# Patient Record
Sex: Female | Born: 1980 | Race: Black or African American | Hispanic: No | Marital: Single | Smoking: Former smoker
Health system: Southern US, Community
[De-identification: ages and names within clinical notes are randomized; demographics above are authoritative.]

## PROBLEM LIST (undated history)

## (undated) DIAGNOSIS — Z789 Other specified health status: Secondary | ICD-10-CM

---
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
## 2001-01-08 ENCOUNTER — Ambulatory Visit (HOSPITAL_COMMUNITY): Admission: RE | Admit: 2001-01-08 | Discharge: 2001-01-08 | Payer: Self-pay | Admitting: Obstetrics and Gynecology

## 2001-01-08 ENCOUNTER — Encounter: Payer: Self-pay | Admitting: Obstetrics and Gynecology

## 2001-03-30 ENCOUNTER — Inpatient Hospital Stay (HOSPITAL_COMMUNITY): Admission: AD | Admit: 2001-03-30 | Discharge: 2001-03-30 | Payer: Self-pay | Admitting: Obstetrics and Gynecology

## 2001-05-21 ENCOUNTER — Observation Stay (HOSPITAL_COMMUNITY): Admission: AD | Admit: 2001-05-21 | Discharge: 2001-05-21 | Payer: Self-pay | Admitting: Obstetrics and Gynecology

## 2001-05-27 ENCOUNTER — Inpatient Hospital Stay (HOSPITAL_COMMUNITY): Admission: AD | Admit: 2001-05-27 | Discharge: 2001-05-27 | Payer: Self-pay | Admitting: Obstetrics and Gynecology

## 2001-06-05 ENCOUNTER — Inpatient Hospital Stay (HOSPITAL_COMMUNITY): Admission: AD | Admit: 2001-06-05 | Discharge: 2001-06-05 | Payer: Self-pay | Admitting: Obstetrics and Gynecology

## 2001-06-24 ENCOUNTER — Inpatient Hospital Stay (HOSPITAL_COMMUNITY): Admission: AD | Admit: 2001-06-24 | Discharge: 2001-06-24 | Payer: Self-pay | Admitting: Obstetrics and Gynecology

## 2001-06-28 ENCOUNTER — Inpatient Hospital Stay (HOSPITAL_COMMUNITY): Admission: AD | Admit: 2001-06-28 | Discharge: 2001-07-03 | Payer: Self-pay | Admitting: Obstetrics and Gynecology

## 2002-07-03 ENCOUNTER — Encounter: Payer: Self-pay | Admitting: Emergency Medicine

## 2002-07-03 ENCOUNTER — Emergency Department (HOSPITAL_COMMUNITY): Admission: EM | Admit: 2002-07-03 | Discharge: 2002-07-03 | Payer: Self-pay | Admitting: Emergency Medicine

## 2003-02-18 ENCOUNTER — Emergency Department (HOSPITAL_COMMUNITY): Admission: AD | Admit: 2003-02-18 | Discharge: 2003-02-18 | Payer: Self-pay | Admitting: Internal Medicine

## 2003-04-16 ENCOUNTER — Emergency Department (HOSPITAL_COMMUNITY): Admission: AD | Admit: 2003-04-16 | Discharge: 2003-04-16 | Payer: Self-pay | Admitting: Family Medicine

## 2003-07-04 ENCOUNTER — Ambulatory Visit (HOSPITAL_COMMUNITY): Admission: RE | Admit: 2003-07-04 | Discharge: 2003-07-04 | Payer: Self-pay | Admitting: Obstetrics & Gynecology

## 2003-08-14 ENCOUNTER — Ambulatory Visit (HOSPITAL_COMMUNITY): Admission: RE | Admit: 2003-08-14 | Discharge: 2003-08-14 | Payer: Self-pay | Admitting: Obstetrics & Gynecology

## 2003-08-27 ENCOUNTER — Inpatient Hospital Stay (HOSPITAL_COMMUNITY): Admission: AD | Admit: 2003-08-27 | Discharge: 2003-08-27 | Payer: Self-pay | Admitting: Obstetrics

## 2003-10-11 ENCOUNTER — Observation Stay (HOSPITAL_COMMUNITY): Admission: AD | Admit: 2003-10-11 | Discharge: 2003-10-11 | Payer: Self-pay | Admitting: Obstetrics

## 2003-11-10 ENCOUNTER — Observation Stay (HOSPITAL_COMMUNITY): Admission: AD | Admit: 2003-11-10 | Discharge: 2003-11-11 | Payer: Self-pay | Admitting: Obstetrics & Gynecology

## 2003-12-15 ENCOUNTER — Ambulatory Visit (HOSPITAL_COMMUNITY): Admission: RE | Admit: 2003-12-15 | Discharge: 2003-12-15 | Payer: Self-pay

## 2003-12-21 ENCOUNTER — Inpatient Hospital Stay (HOSPITAL_COMMUNITY): Admission: AD | Admit: 2003-12-21 | Discharge: 2003-12-21 | Payer: Self-pay | Admitting: Obstetrics

## 2004-01-05 ENCOUNTER — Inpatient Hospital Stay (HOSPITAL_COMMUNITY): Admission: AD | Admit: 2004-01-05 | Discharge: 2004-01-05 | Payer: Self-pay | Admitting: Obstetrics & Gynecology

## 2004-01-08 ENCOUNTER — Inpatient Hospital Stay (HOSPITAL_COMMUNITY): Admission: AD | Admit: 2004-01-08 | Discharge: 2004-01-10 | Payer: Self-pay | Admitting: Obstetrics

## 2004-10-21 ENCOUNTER — Emergency Department (HOSPITAL_COMMUNITY): Admission: EM | Admit: 2004-10-21 | Discharge: 2004-10-22 | Payer: Self-pay | Admitting: Emergency Medicine

## 2005-03-17 HISTORY — PX: DILATION AND CURETTAGE OF UTERUS: SHX78

## 2005-07-23 ENCOUNTER — Ambulatory Visit (HOSPITAL_COMMUNITY): Admission: AD | Admit: 2005-07-23 | Discharge: 2005-07-24 | Payer: Self-pay | Admitting: Obstetrics and Gynecology

## 2005-07-24 ENCOUNTER — Encounter (INDEPENDENT_AMBULATORY_CARE_PROVIDER_SITE_OTHER): Payer: Self-pay | Admitting: Specialist

## 2005-08-27 ENCOUNTER — Emergency Department (HOSPITAL_COMMUNITY): Admission: EM | Admit: 2005-08-27 | Discharge: 2005-08-27 | Payer: Self-pay | Admitting: Family Medicine

## 2005-08-30 ENCOUNTER — Inpatient Hospital Stay (HOSPITAL_COMMUNITY): Admission: AD | Admit: 2005-08-30 | Discharge: 2005-08-30 | Payer: Self-pay | Admitting: Family Medicine

## 2005-09-21 ENCOUNTER — Emergency Department (HOSPITAL_COMMUNITY): Admission: EM | Admit: 2005-09-21 | Discharge: 2005-09-21 | Payer: Self-pay | Admitting: *Deleted

## 2005-12-14 ENCOUNTER — Emergency Department (HOSPITAL_COMMUNITY): Admission: EM | Admit: 2005-12-14 | Discharge: 2005-12-14 | Payer: Self-pay | Admitting: Emergency Medicine

## 2006-03-17 HISTORY — PX: TUBAL LIGATION: SHX77

## 2006-05-22 ENCOUNTER — Ambulatory Visit (HOSPITAL_COMMUNITY): Admission: RE | Admit: 2006-05-22 | Discharge: 2006-05-22 | Payer: Self-pay | Admitting: Obstetrics

## 2006-06-15 ENCOUNTER — Inpatient Hospital Stay (HOSPITAL_COMMUNITY): Admission: AD | Admit: 2006-06-15 | Discharge: 2006-06-15 | Payer: Self-pay | Admitting: Obstetrics

## 2006-06-18 ENCOUNTER — Inpatient Hospital Stay (HOSPITAL_COMMUNITY): Admission: AD | Admit: 2006-06-18 | Discharge: 2006-06-18 | Payer: Self-pay | Admitting: Obstetrics & Gynecology

## 2006-07-19 ENCOUNTER — Inpatient Hospital Stay (HOSPITAL_COMMUNITY): Admission: AD | Admit: 2006-07-19 | Discharge: 2006-07-19 | Payer: Self-pay | Admitting: Obstetrics

## 2006-08-17 ENCOUNTER — Inpatient Hospital Stay (HOSPITAL_COMMUNITY): Admission: AD | Admit: 2006-08-17 | Discharge: 2006-08-17 | Payer: Self-pay | Admitting: Obstetrics & Gynecology

## 2006-09-01 ENCOUNTER — Inpatient Hospital Stay (HOSPITAL_COMMUNITY): Admission: AD | Admit: 2006-09-01 | Discharge: 2006-09-01 | Payer: Self-pay | Admitting: Obstetrics

## 2006-09-07 ENCOUNTER — Inpatient Hospital Stay (HOSPITAL_COMMUNITY): Admission: AD | Admit: 2006-09-07 | Discharge: 2006-09-07 | Payer: Self-pay | Admitting: Obstetrics

## 2006-09-13 ENCOUNTER — Inpatient Hospital Stay (HOSPITAL_COMMUNITY): Admission: AD | Admit: 2006-09-13 | Discharge: 2006-09-13 | Payer: Self-pay | Admitting: Obstetrics & Gynecology

## 2006-09-23 ENCOUNTER — Inpatient Hospital Stay (HOSPITAL_COMMUNITY): Admission: AD | Admit: 2006-09-23 | Discharge: 2006-09-23 | Payer: Self-pay | Admitting: Obstetrics

## 2006-10-03 ENCOUNTER — Inpatient Hospital Stay (HOSPITAL_COMMUNITY): Admission: AD | Admit: 2006-10-03 | Discharge: 2006-10-05 | Payer: Self-pay | Admitting: Obstetrics

## 2006-10-12 ENCOUNTER — Inpatient Hospital Stay (HOSPITAL_COMMUNITY): Admission: AD | Admit: 2006-10-12 | Discharge: 2006-10-12 | Payer: Self-pay | Admitting: Obstetrics

## 2006-10-16 ENCOUNTER — Inpatient Hospital Stay (HOSPITAL_COMMUNITY): Admission: AD | Admit: 2006-10-16 | Discharge: 2006-10-20 | Payer: Self-pay | Admitting: Obstetrics

## 2006-10-17 ENCOUNTER — Inpatient Hospital Stay (HOSPITAL_COMMUNITY): Admission: AD | Admit: 2006-10-17 | Discharge: 2006-10-20 | Payer: Self-pay | Admitting: Obstetrics

## 2006-10-18 ENCOUNTER — Encounter: Payer: Self-pay | Admitting: Obstetrics

## 2006-12-07 ENCOUNTER — Emergency Department (HOSPITAL_COMMUNITY): Admission: EM | Admit: 2006-12-07 | Discharge: 2006-12-08 | Payer: Self-pay | Admitting: Emergency Medicine

## 2008-09-26 ENCOUNTER — Emergency Department (HOSPITAL_COMMUNITY): Admission: EM | Admit: 2008-09-26 | Discharge: 2008-09-26 | Payer: Self-pay | Admitting: Family Medicine

## 2009-09-22 ENCOUNTER — Emergency Department (HOSPITAL_COMMUNITY): Admission: EM | Admit: 2009-09-22 | Discharge: 2009-09-22 | Payer: Self-pay | Admitting: Emergency Medicine

## 2009-10-28 ENCOUNTER — Emergency Department (HOSPITAL_COMMUNITY): Admission: EM | Admit: 2009-10-28 | Discharge: 2009-10-28 | Payer: Self-pay | Admitting: Family Medicine

## 2009-10-30 ENCOUNTER — Emergency Department (HOSPITAL_COMMUNITY): Admission: EM | Admit: 2009-10-30 | Discharge: 2009-10-30 | Payer: Self-pay | Admitting: Emergency Medicine

## 2010-05-30 LAB — WET PREP, GENITAL: Yeast Wet Prep HPF POC: NONE SEEN

## 2010-05-30 LAB — URINE MICROSCOPIC-ADD ON

## 2010-05-30 LAB — URINALYSIS, ROUTINE W REFLEX MICROSCOPIC
Bilirubin Urine: NEGATIVE
Bilirubin Urine: NEGATIVE
Glucose, UA: NEGATIVE mg/dL
Hgb urine dipstick: NEGATIVE
Nitrite: NEGATIVE
Protein, ur: NEGATIVE mg/dL
Specific Gravity, Urine: 1.027 (ref 1.005–1.030)
Urobilinogen, UA: 1 mg/dL (ref 0.0–1.0)

## 2010-05-30 LAB — GC/CHLAMYDIA PROBE AMP, GENITAL
Chlamydia, DNA Probe: NEGATIVE
GC Probe Amp, Genital: NEGATIVE

## 2010-06-02 LAB — POCT PREGNANCY, URINE: Preg Test, Ur: NEGATIVE

## 2010-07-30 NOTE — Op Note (Signed)
NAMENICOLAS, [name]   MEDICAL RECORD NO.:  [id number]          PATIENT TYPE:  INP   LOCATION:  9130                          FACILITY:  [facility]   PHYSICIAN:  Charles A. Clearance Coots, M.D.DATE OF BIRTH:  [birth year]   DATE OF PROCEDURE:  10/19/2006  DATE OF DISCHARGE:                               OPERATIVE REPORT   PREOPERATIVE DIAGNOSIS:  Desires sterilization.   POSTOPERATIVE DIAGNOSIS:  Desires sterilization.   PROCEDURE:  Bilateral partial salpingectomy.   SURGEON:  Charles A. Clearance Coots, MD   ANESTHESIA:  Epidural.   ESTIMATED BLOOD LOSS:  Negligible.   COMPLICATIONS:  None.   SPECIMEN:  Approximately 2-cm segments of right and left fallopian tube.   OPERATION:  The patient was brought to the operating room and after  satisfactory redosing of the epidural, the abdomen was prepped and  draped in the usual sterile fashion.  A small inferior umbilical  incision was made through the skin with the scalpel and was deepened  down to the fascia with a curved Mayo scissors bluntly.  Right-angle  retractors were placed in the incision and the fascia was grasped with  Kocher forceps and was cut in between with curved Mayo scissors.  The  incision was extended to the left and to the right with curved Mayo  scissors.  The peritoneum was entered bluntly with the [employer]  retractors and the right fallopian tube was identified and was grasped  with a Babcock clamp.  The tube was followed and grasped with Babcock  clamps from the cornual end to the fimbrial end and then regrasped in  the isthmic area of the tube with the Babcock clamp.  A knuckle of tube  beneath the Babcock clamp was ligated with 0 plain catgut and a section  of tube above the knot was excised with Metzenbaum scissors and  submitted to pathology for evaluation.  There was no active bleeding  from the tubal stumps, therefore placed back in its normal anatomic  position.  The same procedure  was performed on the opposite side without  complications.  The abdomen was then closed as follows:  Peritoneum and  fascia was closed as one with a continuous suture of 2-0 Vicryl; the  skin was closed with a continuous subcuticular suture of 3-0 Monocryl.  A sterile bandage was applied to the incision closure.  The surgical  technician indicated that all needle, sponge and instrument counts were  correct x2.  The patient tolerated the procedure well and was  transported to the recovery room in satisfactory condition.     Charles A. Clearance Coots, M.D.  Electronically Signed    CAH/MEDQ  D:  10/19/2006  T:  10/19/2006  Job:  [id number]

## 2010-08-02 NOTE — Discharge Summary (Signed)
[name], [name]           ACCOUNT NO.:  [id number]   MEDICAL RECORD NO.:  [id number]          PATIENT TYPE:  MAT   LOCATION:  MATC                          [facility]ACILITY:  [facility]   PHYSICIAN:  Charles A. Clearance Coots, M.D.DATE OF BIRTH:  1980-08-23   DATE OF ADMISSION:  10/16/2006  DATE OF DISCHARGE:  10/20/2006                               DISCHARGE SUMMARY   ADMITTING DIAGNOSES:  1. Thirty-eight weeks gestation, active labor.  2. Previous cesarean section, desired trial of labor.  3. Desired sterilization.   DISCHARGE DIAGNOSES:  1. Thirty-eight weeks gestation, active labor.  2. Previous cesarean section, desired trial of labor.  3. Desired sterilization.  4. Status post normal spontaneous vaginal delivery, viable female, on      10/18/2006 at 0541 after Apgar's of 9 at 1 minute, 9 at 5 minutes,      weight of 2730 grams, length of 49.5 cm, status post bilateral      partial salpingectomy on postpartum day #1.  5. Mother and infant discharged home on postpartum day #2, in good      condition   REASON FOR ADMISSION:  This is a 30 year old G4, P2, estimated date of  confinement of 10/30/2006, presented with uterine contractions. The  patient has a history of previous cesarean section followed by a second  pregnancy with a normal spontaneous vaginal delivery. The patient  desired trial of labor with this pregnancy. Current pregnancy was  uncomplicated.   PAST MEDICAL HISTORY:  Surgery:  Cesarean section.  Illnesses:  None.   MEDICATIONS:  Prenatal vitamins   ALLERGIES:  IODINE.   SOCIAL HISTORY:  Single. Negative tobacco, alcohol or recreational drug  use.   PHYSICAL EXAMINATION:  GENERAL:  Well-nourished, well-developed female  in no acute distress, afebrile.  VITAL SIGNS:  Vital signs were stable.  LUNGS:  Lungs were clear to auscultation bilaterally.  HEART:  Heart: Regular rate and rhythm.  ABDOMEN:  Abdomen gravid, nontender.  Cervix 3 cm dilated, 90% effaced  and  vertex at minus two station.   IMPRESSION:  This is a [redacted] weeks gestation, active labor, previous  cesarean section, desired trial of labor, desired sterilization.   HOSPITAL COURSE:  The patient was admitted and progressed to the normal  spontaneous vaginal delivery. Viable infant without complications.  She  was taken to the operating room on postpartum day #1 for tubal ligation  and this was performed without complications. The remainder of the  postpartum course was uncomplicated.  The patient was discharged home on  post partum day #2 in good condition.   LABORATORY DATA:  Discharge laboratory values; hemoglobin 8.1,  hematocrit 24.4, white blood cell count 16,000, platelets 122,000. The  patient did have anemia postpartum, but this was clinically unremarkable  hemodynamically with no orthostatic blood pressure changes   DISCHARGE DISPOSITION:  1. Medications; continue prenatal vitamins, iron was prescribed for      anemia, ibuprofen was prescribed for pain.  2. Routine written instructions were given for discharge after vaginal      delivery and tubal ligation.  3. The patient is to call office for follow-up appointment  in 2 weeks.      Charles A. Clearance Coots, M.D.  Electronically Signed     CAH/MEDQ  D:  10/22/2006  T:  10/22/2006  Job:  [id number]

## 2010-08-02 NOTE — Op Note (Signed)
[facility]  Patient:    [name], [name] Visit Number: [id number] MRN: [id number]          Service Type: OBS Location: 910B 9163 01 Attending Physician:  Melony Overly Dictated by:   Janeece Riggers Dareen Piano, M.D. Proc. Date: 06/29/01 Admit Date:  06/28/2001                             Operative Report  PREOPERATIVE DIAGNOSES:       1. Intrauterine pregnancy at 45 weeks estimated                                  gestational age.                               2. Intrauterine growth retardation.                               3. Double nuchal cord.                               4. Compound presentation.  POSTOPERATIVE DIAGNOSES:      1. Intrauterine pregnancy at 24 weeks estimated                                  gestational age.                               2. Intrauterine growth retardation.                               3. Double nuchal cord.                               4. Compound presentation.  PROCEDURE:                    Primary low transverse cesarean section.  SURGEON:                      Mark E. Dareen Piano, M.D.  ANESTHESIA:                   Epidural.  ANTIBIOTICS:                  Ancef 1 gm.  SPECIMENS:                    None.  ESTIMATED BLOOD LOSS:         900 cc.  COMPLICATIONS:                None.  DRAIN:                        Foley to bedside drainage.  DESCRIPTION OF PROCEDURE:     The patient was taken to the operating room where her epidural anesthetic was reinjected.  Once an adequate level was reached, the patient was prepped with  Hibiclens and a Foley catheter was placed.  She was draped in the usual fashion for this procedure.  A Pfannenstiel incision was made.  This was carried down to the fascia.  The fascia was entered in the midline and extended laterally with the Mayo scissors.  The rectus muscles were dissected from the fascia with the Bovie. The rectus muscles were divided in the midline and taken  superiorly and inferiorly.  The parietal peritoneum was entered sharply.  The bladder flap was taken down sharply.  A low transverse uterine incision was made in the midline and extended laterally with blunt dissection.  The amniotic sac was entered sharply.  The infant was delivered with the vacuum extractor and the double nuchal cord reduced.  The remaining infant was then delivered.  The cord was clamped and cut and the infant handed to the waiting ICU team.  The cord blood was then obtained.  The placenta was then manually removed.  The uterus was exteriorized.  The uterine cavity was wiped with a wet lap.  The uterine incision was closed in a single layer of #0 chromic in a running locking fashion.  A small area of bleeding on the right corner was made hemostatic with interrupted #0 Monocryl suture.  The bladder flap was not closed.  The uterus was placed back in the abdominal cavity.  The abdominal gutters were wiped out with a wet lap.  Hemostasis was checked and felt to be adequate.  The frail-appearing ____ rectus muscles were reapproximated in the midline using #3-0 chromic in a running fashion.  The fascia was closed using #0 Monocryl in a running fashion.  Stainless steel clips were used to close the skin.  The patient tolerated the procedure well and was taken to the recovery room in stable condition.  The instrument and lap counts were correct x 2. Dictated by:   Janeece Riggers Dareen Piano, M.D. Attending Physician:  Melony Overly DD:  06/29/01 TD:  06/29/01 Job: [id number]

## 2010-08-02 NOTE — Op Note (Signed)
[name], [name]           ACCOUNT NO.:  [id number]   MEDICAL RECORD NO.:  [id number]          PATIENT TYPE:  AMB   LOCATION:  MATC                          FACILITY:  [facility]   PHYSICIAN:  James A. Ashley Royalty, M.D.DATE OF BIRTH:  01-24-1981   DATE OF PROCEDURE:  DATE OF DISCHARGE:  07/23/2005                                 OPERATIVE REPORT   PREOPERATIVE DIAGNOSIS:  Inevitable/missed abortion at approximately 8  weeks' gestation.   POSTOPERATIVE DIAGNOSIS:  Inevitable/missed abortion at approximately 8  weeks' gestation, pathology pending.   PROCEDURE:  Suction dilatation and curettage.   SURGEON:  Rudy Jew. Ashley Royalty, M.D.   ANESTHESIA:  Monitoring anesthesia care with 1% Xylocaine paracervical block  (20 mL).   FINDINGS:  Uterus sounded to approximately 10 cm.  Moderate amount of the  apparent products of conception obtained.   ESTIMATED BLOOD LOSS:  50 mL.   COMPLICATIONS:  None.   PACKS AND DRAINS:  None.   PROCEDURE:  The patient was taken to the operating room and placed in the  dorsal supine position.  After IV sedation was administered, she was placed  in the lithotomy position and prepped and draped in the usual manner for  vaginal surgery.  A posterior weighted retractor was placed per vagina.  The  anterior of the cervix was grasped with a single-tooth tenaculum.  The  uterus was gently sounded to approximately 10 cm.  Xylocaine 1% 20 mL was  infiltrated the cervix circumferentially to create a paracervical block.  The cervix was then dilated to a size 25 [place] with [employer] dilators.  Initially an 8 mm suction curette was introduced into the uterine cavity.  After several passes with the suction curette, it was evident that  additional tissue was present in the uterus.  Hence, the operator chose to  change to a 10 mm curette.  A 10 mm curette was introduced into the uterine  cavity and suction applied.  Additional products of conception were then  obtained.   After several passes with a 10 mm curette, no additional tissue  was obtained.  At this point the patient was felt to have benefited  maximally from the surgical procedure.  The vaginal instruments removed.  There was a small laceration on the right side of the cervical tenaculum  site.  This  was easily closed with 2-0 chromic in a figure-of-eight fashion.  Hemostasis  was noted and the procedure was terminated.  Intraoperatively the patient  received 1 g of Ancef.   The patient was then taken to the recovery room in excellent condition.      James A. Ashley Royalty, M.D.  Electronically Signed     JAM/MEDQ  D:  07/24/2005  T:  07/25/2005  Job:  [id number]

## 2010-08-02 NOTE — Discharge Summary (Signed)
[facility]  Patient:    [name], [name] Visit Number: [id number] MRN: [id number]          Service Type: OBS Location: 910A 9135 01 Attending Physician:  Melony Overly Dictated by:   Gerrit Friends. Aldona Bar, M.D. Admit Date:  06/28/2001 Discharge Date: 07/03/2001                             Discharge Summary  DISCHARGE DIAGNOSES: 1. A 38 week pregnancy, delivered, 4 pounds 13 ounce female infant with Apgars    of 8 and 9. 2. Blood type O+. 3. Intrauterine growth restriction. 4. Failed induction.  PROCEDURES:  Primary low transverse cesarean section.  HISTORY OF PRESENT ILLNESS:  This 30 year old, gravida 1, para 0, was followed in the office closely for IUGR.  She was admitted on 06/28/01 for induction.  HOSPITAL COURSE:  She began be receiving Cytotec, and subsequently went into good labor.  She underwent amniotomy with production of clear fluid, and afterwards was noted to have a compound presentation, and was taken to the operating room for delivery by a primary low transverse cesarean section.  She was delivered of a 4 pound 13 ounce female infant with good Apgars.  There was a double nuchal cord, in addition to the compound presentation.  Her postpartum course was benign.  Her discharge hemoglobin was 7.1, white count 11,400, platelet count 135,000.  This had been repeated from 06/30/01, with essentially the same values except for a platelet count that was now rising.  On the morning of 07/02/01, she was ambulating well, tolerating a regular diet well, having normal bowel and bladder function, was afebrile, her wound was clean and dry.  She was not breast-feeding.  She was tolerating a regular diet well, and she was deemed ready for discharge.  She was having normal bowel and bladder function.  Accordingly, she was given all appropriate prescriptions after a detailed instruction sheet.  DISCHARGE MEDICATIONS: 1. Motrin 600 mg q.6h. 2. Tylox  one or two q.4-6h. p.r.n. severe pain. 3. Ferrous sulfate 300 mg at least once to b.i.d. until her followup in the    office in approximately four weeks time.  CONDITION ON DISCHARGE:  Improved.  At the time of discharge, her staples were removed and her wound was steri-striped with benzoin. Dictated by:   Gerrit Friends. Aldona Bar, M.D. Attending Physician:  Melony Overly DD:  07/02/01 TD:  07/03/01 Job: [id number]

## 2010-10-02 ENCOUNTER — Encounter (HOSPITAL_COMMUNITY): Payer: Self-pay | Admitting: *Deleted

## 2010-10-02 ENCOUNTER — Inpatient Hospital Stay (HOSPITAL_COMMUNITY)
Admission: AD | Admit: 2010-10-02 | Discharge: 2010-10-03 | Disposition: A | Discharge status: Home / Self Care | Payer: Self-pay | Source: Ambulatory Visit | Attending: Obstetrics and Gynecology | Admitting: Obstetrics and Gynecology

## 2010-10-02 DIAGNOSIS — N938 Other specified abnormal uterine and vaginal bleeding: Secondary | ICD-10-CM | POA: Insufficient documentation

## 2010-10-02 DIAGNOSIS — N949 Unspecified condition associated with female genital organs and menstrual cycle: Secondary | ICD-10-CM | POA: Insufficient documentation

## 2010-10-02 DIAGNOSIS — N925 Other specified irregular menstruation: Secondary | ICD-10-CM

## 2010-10-02 DIAGNOSIS — A5901 Trichomonal vulvovaginitis: Secondary | ICD-10-CM | POA: Insufficient documentation

## 2010-10-02 DIAGNOSIS — A599 Trichomoniasis, unspecified: Secondary | ICD-10-CM

## 2010-10-02 HISTORY — DX: Other specified health status: Z78.9

## 2010-10-02 LAB — URINE MICROSCOPIC-ADD ON

## 2010-10-02 LAB — URINALYSIS, ROUTINE W REFLEX MICROSCOPIC
Bilirubin Urine: NEGATIVE
Hgb urine dipstick: NEGATIVE
Specific Gravity, Urine: 1.025 (ref 1.005–1.030)
pH: 7 (ref 5.0–8.0)

## 2010-10-02 NOTE — ED Notes (Signed)
Pt has had pain in lower abd for 1wk. Intermittent cramping and sharp, constant pain. Diarrhea past couple days. Today some dizziness. LMP 09/21/2010

## 2010-10-02 NOTE — Progress Notes (Cosign Needed)
Pt having lower abd cramping, clear discharge and spotting.  Pt having pain, burning and frequency with urination.  Pt LMP 09/21/2010, G4P3.  BTL 10/2006.

## 2010-10-03 ENCOUNTER — Encounter (HOSPITAL_COMMUNITY): Payer: Self-pay | Admitting: Obstetrics and Gynecology

## 2010-10-03 LAB — WET PREP, GENITAL: Yeast Wet Prep HPF POC: NONE SEEN

## 2010-10-03 MED ORDER — METRONIDAZOLE 500 MG PO TABS: 500.0000 mg | ORAL_TABLET | Freq: Two times a day (BID) | ORAL | Status: DC

## 2010-10-03 MED ORDER — METRONIDAZOLE 500 MG PO TABS: 500.0000 mg | ORAL_TABLET | Freq: Two times a day (BID) | ORAL | Status: AC

## 2010-10-03 NOTE — ED Provider Notes (Signed)
Patient's wet prep is positive for wbc and Trichomoniasis

## 2010-10-03 NOTE — ED Provider Notes (Addendum)
30 yr G4P3-0-1-3 LPM 15 September 2010, s/p Btl, sexually active, single partner x 1 yr with notice of lite blood at time of wiping today.  Lmp normal , spotting today described as lite, lasting only briefly.  No recent gyn care or gyn problems.  Denies dyspareunia, heavy menses. SH Btl, Cesarean section x1 with Ist pregnancy, age 30. PEX: Alert oriented, NAD, Abd soft with normal BS, no guarding or rebound. EFG normal female,  Vag": heavy yellow discharge,  Cx Nonpurulent.  Neg CMT Ut midplane ULN, lite ly sensitive, no suspicion of PID. Adnexa - for masses or tenderness.Unknown  Gc and chlamydia wet prep collected.

## 2010-10-03 NOTE — ED Notes (Signed)
Written and verba d/c instructions given and understanding voiced. Pt d/c amb with her mother.

## 2010-10-03 NOTE — Progress Notes (Signed)
Dr Emelda Fear in to see pt. Spec exam done. Wet prep and GC/Chlam obtained. Pt tol well

## 2010-10-04 LAB — GC/CHLAMYDIA PROBE AMP, GENITAL: Chlamydia, DNA Probe: NEGATIVE

## 2010-12-26 LAB — URINE CULTURE

## 2010-12-26 LAB — URINE MICROSCOPIC-ADD ON

## 2010-12-26 LAB — URINALYSIS, ROUTINE W REFLEX MICROSCOPIC
Glucose, UA: NEGATIVE
Ketones, ur: 15 — AB
Nitrite: POSITIVE — AB
Specific Gravity, Urine: 1.023
pH: 5.5

## 2010-12-30 LAB — CBC
HCT: 24.4 — ABNORMAL LOW
HCT: 31.8 — ABNORMAL LOW
HCT: 24.5 — ABNORMAL LOW
Hemoglobin: 10.5 — ABNORMAL LOW
Hemoglobin: 8.1 — ABNORMAL LOW
Hemoglobin: 8.1 — ABNORMAL LOW
MCHC: 33.3
MCHC: 33.2
MCV: 88.6
MCV: 90
Platelets: 162
RBC: 2.75 — ABNORMAL LOW
RBC: 3.59 — ABNORMAL LOW
RDW: 14.7 — ABNORMAL HIGH
RDW: 14.3 — ABNORMAL HIGH
WBC: 16.1 — ABNORMAL HIGH

## 2010-12-30 LAB — URINALYSIS, ROUTINE W REFLEX MICROSCOPIC
Bilirubin Urine: NEGATIVE
Glucose, UA: NEGATIVE
Hgb urine dipstick: NEGATIVE
Ketones, ur: NEGATIVE
Protein, ur: NEGATIVE
pH: 8

## 2011-01-01 LAB — URINALYSIS, ROUTINE W REFLEX MICROSCOPIC
Bilirubin Urine: NEGATIVE
Glucose, UA: NEGATIVE
Hgb urine dipstick: NEGATIVE
Hgb urine dipstick: NEGATIVE
Ketones, ur: 40 — AB
Ketones, ur: NEGATIVE
Specific Gravity, Urine: 1.025
pH: 6
pH: 6

## 2011-01-01 LAB — URINE MICROSCOPIC-ADD ON

## 2011-01-01 LAB — URINE CULTURE: Colony Count: 35000

## 2011-01-01 LAB — FETAL FIBRONECTIN: Fetal Fibronectin: NEGATIVE

## 2012-03-18 ENCOUNTER — Encounter (HOSPITAL_COMMUNITY): Payer: Self-pay | Admitting: Cardiology

## 2012-03-18 ENCOUNTER — Emergency Department (HOSPITAL_COMMUNITY)
Admission: EM | Admit: 2012-03-18 | Discharge: 2012-03-18 | Disposition: A | Discharge status: Home / Self Care | Payer: Self-pay | Attending: Emergency Medicine | Admitting: Emergency Medicine

## 2012-03-18 DIAGNOSIS — F17200 Nicotine dependence, unspecified, uncomplicated: Secondary | ICD-10-CM | POA: Insufficient documentation

## 2012-03-18 DIAGNOSIS — J029 Acute pharyngitis, unspecified: Secondary | ICD-10-CM | POA: Insufficient documentation

## 2012-03-18 DIAGNOSIS — R51 Headache: Secondary | ICD-10-CM | POA: Insufficient documentation

## 2012-03-18 DIAGNOSIS — H9209 Otalgia, unspecified ear: Secondary | ICD-10-CM | POA: Insufficient documentation

## 2012-03-18 LAB — RAPID STREP SCREEN (MED CTR MEBANE ONLY): Streptococcus, Group A Screen (Direct): NEGATIVE

## 2012-03-18 MED ORDER — AMOXICILLIN 500 MG PO CAPS: 500.0000 mg | ORAL_CAPSULE | Freq: Three times a day (TID) | ORAL | Status: DC

## 2012-03-18 MED ORDER — OXYCODONE-ACETAMINOPHEN 5-325 MG PO TABS
1.0000 | ORAL_TABLET | Freq: Once | ORAL | Status: AC
  Administered 2012-03-18: 1 via ORAL
  Filled 2012-03-18: qty 1

## 2012-03-18 MED ORDER — OXYCODONE-ACETAMINOPHEN 5-325 MG PO TABS: 1.0000 | ORAL_TABLET | ORAL | Status: DC | PRN

## 2012-03-18 NOTE — ED Provider Notes (Signed)
32 year old female comes in with several day history of a sore throat and difficulty swallowing. On exam, she has moderate erythema of the pharynx with swelling of the left peritonsillar area. However, uvula does retract in the midline. She's not having any difficulty with her secretions and I did not feel that she needs emergent drainage of a possible peritonsillar abscess. She'll be treated with antibiotics and steroids and referred to ENT for followup in the next 24 hours.  Medical screening examination/treatment/procedure(s) were conducted as a shared visit with non-physician practitioner(s) and myself.  I personally evaluated the patient during the encounter   Dione Booze, MD 03/18/12 1932

## 2012-03-18 NOTE — ED Notes (Signed)
Pt reports sore throat and left ear pain for the past week. Unknown fever. Reports generalized fatigue.

## 2012-03-18 NOTE — ED Provider Notes (Signed)
History   This chart was scribed for non-physician practitioner working with Carly Booze, MD by Frederik Pear, ED Scribe. This patient was seen in room TR06C/TR06C and the patient's care was started at 1916.   CSN: [id number]  Arrival date & time 03/18/12  1742   First MD Initiated Contact with Patient 03/18/12 1916      Chief Complaint  Patient presents with  . Sore Throat  . Otalgia    (Consider location/radiation/quality/duration/timing/severity/associated sxs/prior treatment) Patient is a 32 y.o. female presenting with pharyngitis and ear pain.  Sore Throat This is a new problem. The current episode started more than 1 week ago. The problem occurs constantly. The problem has not changed since onset.Associated symptoms include headaches. Pertinent negatives include no abdominal pain. Nothing aggravates the symptoms. Nothing relieves the symptoms. She has tried nothing for the symptoms.  Otalgia Associated symptoms include headaches and sore throat. Pertinent negatives include no abdominal pain.    KARLEE STAFF is a 32 y.o. female who presents to the Emergency Department complaining of a constant, moderate sore throat with associated left-sided otalgia and headache that began last week. She denies any associated nausea, rhinorrhea, fever, emesis, rashes, or abdominal pain. She reports that her children have recently been sick with similar symptoms.  Past Medical History  Diagnosis Date  . No pertinent past medical history     Past Surgical History  Procedure Date  . Dilation and curettage of uterus 2007  . Tubal ligation 2008  . Cesarean section 2004    History reviewed. No pertinent family history.  History  Substance Use Topics  . Smoking status: Current Every Day Smoker  . Smokeless tobacco: Not on file  . Alcohol Use: Yes    OB History    Grav Para Term Preterm Abortions TAB SAB Ect Mult Living   4 3 0  1  1   3       Review of Systems  HENT: Positive  for ear pain and sore throat.   Gastrointestinal: Negative for abdominal pain.  Neurological: Positive for headaches.  All other systems reviewed and are negative.    Allergies  Iodine  Home Medications   Current Outpatient Rx  Name  Route  Sig  Dispense  Refill  . ASPIRIN-ACETAMINOPHEN-CAFFEINE 250-250-65 MG PO TABS   Oral   Take 1.5 tablets by mouth every 6 (six) hours as needed. For headache         . IBUPROFEN 200 MG PO TABS   Oral   Take 400 mg by mouth every 6 (six) hours as needed. For pain           BP 112/79  Pulse 98  Temp 99 F (37.2 C) (Oral)  Resp 18  SpO2 100%  Physical Exam  Nursing note and vitals reviewed. Constitutional: She is oriented to person, place, and time. She appears well-developed and well-nourished.  HENT:  Head: Normocephalic and atraumatic.  Mouth/Throat:    Eyes: Conjunctivae normal are normal. Pupils are equal, round, and reactive to light.  Neck: Normal range of motion.  Cardiovascular: Normal rate, regular rhythm and normal heart sounds.   Pulmonary/Chest: Effort normal and breath sounds normal.  Abdominal: Soft. Bowel sounds are normal.  Musculoskeletal: Normal range of motion.  Lymphadenopathy:    She has no cervical adenopathy.  Neurological: She is alert and oriented to person, place, and time.  Skin: Skin is warm and dry.  Psychiatric: She has a normal mood and affect. Her behavior is  normal. Judgment and thought content normal.    ED Course  Procedures (including critical care time)  DIAGNOSTIC STUDIES: Oxygen Saturation is 100% on room air, normal by my interpretation.    COORDINATION OF CARE:  19:20- Discussed planned course of treatment with the patient, including pain medication and an incision and drainage, who is agreeable at this time.  20:15- Medication Orders- oxycodone-acetaminophen (percocet/roxicet) 5-325 mg tablet per tablet- 1 tablet.  20:32- Recheck- She has a follow up appointment with Dr.  Lazarus Salines for ENT on 01/03.  Results for orders placed during the hospital encounter of 03/18/12  RAPID STREP SCREEN      Component Value Range   Streptococcus, Group A Screen (Direct) NEGATIVE  NEGATIVE      Labs Reviewed  RAPID STREP SCREEN   No results found.   No diagnosis found.  Pharyngitis with significant oropharyngeal swelling.  No airway compromise. Discussed with Dr. Preston Fleeting and with ENT.  Antibiotics initiated.  ENT will see in office tomorrow to evaluate.  MDM    I personally performed the services described in this documentation, which was scribed in my presence. The recorded information has been reviewed and is accurate.        Jimmye Norman, NP 03/19/12 0004

## 2013-10-06 ENCOUNTER — Emergency Department (HOSPITAL_COMMUNITY)
Admission: EM | Admit: 2013-10-06 | Discharge: 2013-10-07 | Disposition: A | Discharge status: Home / Self Care | Payer: Self-pay | Attending: Emergency Medicine | Admitting: Emergency Medicine

## 2013-10-06 ENCOUNTER — Encounter (HOSPITAL_COMMUNITY): Payer: Self-pay | Admitting: Emergency Medicine

## 2013-10-06 DIAGNOSIS — R1110 Vomiting, unspecified: Secondary | ICD-10-CM | POA: Insufficient documentation

## 2013-10-06 DIAGNOSIS — Z79899 Other long term (current) drug therapy: Secondary | ICD-10-CM | POA: Insufficient documentation

## 2013-10-06 DIAGNOSIS — N390 Urinary tract infection, site not specified: Secondary | ICD-10-CM | POA: Insufficient documentation

## 2013-10-06 DIAGNOSIS — F17200 Nicotine dependence, unspecified, uncomplicated: Secondary | ICD-10-CM | POA: Insufficient documentation

## 2013-10-06 LAB — PREGNANCY, URINE: Preg Test, Ur: NEGATIVE

## 2013-10-06 LAB — URINALYSIS, ROUTINE W REFLEX MICROSCOPIC
Bilirubin Urine: NEGATIVE
GLUCOSE, UA: NEGATIVE mg/dL
HGB URINE DIPSTICK: NEGATIVE
Ketones, ur: NEGATIVE mg/dL
Nitrite: NEGATIVE
PH: 6.5 (ref 5.0–8.0)
PROTEIN: NEGATIVE mg/dL
Specific Gravity, Urine: 1.023 (ref 1.005–1.030)
Urobilinogen, UA: 0.2 mg/dL (ref 0.0–1.0)

## 2013-10-06 LAB — COMPREHENSIVE METABOLIC PANEL
ALK PHOS: 69 U/L (ref 39–117)
ALT: 26 U/L (ref 0–35)
ANION GAP: 13 (ref 5–15)
AST: 25 U/L (ref 0–37)
Albumin: 4.3 g/dL (ref 3.5–5.2)
BILIRUBIN TOTAL: 0.4 mg/dL (ref 0.3–1.2)
BUN: 9 mg/dL (ref 6–23)
CHLORIDE: 103 meq/L (ref 96–112)
CO2: 22 meq/L (ref 19–32)
CREATININE: 0.58 mg/dL (ref 0.50–1.10)
Calcium: 9.4 mg/dL (ref 8.4–10.5)
GLUCOSE: 96 mg/dL (ref 70–99)
POTASSIUM: 4.3 meq/L (ref 3.7–5.3)
Sodium: 138 mEq/L (ref 137–147)
Total Protein: 8 g/dL (ref 6.0–8.3)

## 2013-10-06 LAB — LIPASE, BLOOD: LIPASE: 29 U/L (ref 11–59)

## 2013-10-06 LAB — CBC WITH DIFFERENTIAL/PLATELET
BASOS ABS: 0 10*3/uL (ref 0.0–0.1)
BASOS PCT: 0 % (ref 0–1)
Eosinophils Absolute: 0.1 10*3/uL (ref 0.0–0.7)
Eosinophils Relative: 1 % (ref 0–5)
HCT: 34.6 % — ABNORMAL LOW (ref 36.0–46.0)
Hemoglobin: 11.4 g/dL — ABNORMAL LOW (ref 12.0–15.0)
LYMPHS PCT: 28 % (ref 12–46)
Lymphs Abs: 2.2 10*3/uL (ref 0.7–4.0)
MCH: 30.3 pg (ref 26.0–34.0)
MCHC: 32.9 g/dL (ref 30.0–36.0)
MCV: 92 fL (ref 78.0–100.0)
Monocytes Absolute: 0.7 10*3/uL (ref 0.1–1.0)
Monocytes Relative: 9 % (ref 3–12)
NEUTROS ABS: 4.8 10*3/uL (ref 1.7–7.7)
Neutrophils Relative %: 62 % (ref 43–77)
PLATELETS: 185 10*3/uL (ref 150–400)
RBC: 3.76 MIL/uL — ABNORMAL LOW (ref 3.87–5.11)
RDW: 13.2 % (ref 11.5–15.5)
WBC: 7.8 10*3/uL (ref 4.0–10.5)

## 2013-10-06 LAB — URINE MICROSCOPIC-ADD ON

## 2013-10-06 MED ORDER — CEPHALEXIN 250 MG PO CAPS
500.0000 mg | ORAL_CAPSULE | Freq: Once | ORAL | Status: AC
Start: 2013-10-06 — End: 2013-10-06
  Administered 2013-10-06: 500 mg via ORAL
  Filled 2013-10-06: qty 2

## 2013-10-06 MED ORDER — CEPHALEXIN 500 MG PO CAPS: 500.0000 mg | ORAL_CAPSULE | Freq: Two times a day (BID) | ORAL | Status: AC

## 2013-10-06 MED ORDER — ONDANSETRON 4 MG PO TBDP
4.0000 mg | ORAL_TABLET | Freq: Once | ORAL | Status: AC
  Administered 2013-10-06: 4 mg via ORAL
  Filled 2013-10-06: qty 1

## 2013-10-06 MED ORDER — SODIUM CHLORIDE 0.9 % IV BOLUS (SEPSIS)
1000.0000 mL | Freq: Once | INTRAVENOUS | Status: AC
  Administered 2013-10-06: 1000 mL via INTRAVENOUS

## 2013-10-06 MED ORDER — KETOROLAC TROMETHAMINE 15 MG/ML IJ SOLN
15.0000 mg | Freq: Once | INTRAMUSCULAR | Status: AC
  Administered 2013-10-06: 15 mg via INTRAVENOUS
  Filled 2013-10-06: qty 1

## 2013-10-06 MED ORDER — PROMETHAZINE HCL 12.5 MG PO TABS
12.5000 mg | ORAL_TABLET | Freq: Once | ORAL | Status: AC
  Administered 2013-10-06: 12.5 mg via ORAL
  Filled 2013-10-06: qty 1

## 2013-10-06 NOTE — ED Notes (Signed)
Pt states she feels dizzy and nauseous.

## 2013-10-06 NOTE — ED Notes (Signed)
Pt in c/o vomiting and dizziness upon standing since this morning, also mild headache and lower back pain, alert and oriented, no distress noted

## 2013-10-06 NOTE — ED Notes (Signed)
Will wait 30-45 minutes to ensure pt does not have an allergic reaction to the medication.

## 2013-10-06 NOTE — ED Provider Notes (Signed)
CSN: [id number]     Arrival date & time 10/06/13  4098 History   First MD Initiated Contact with Patient 10/06/13 2004     Chief Complaint  Patient presents with  . Emesis     (Consider location/radiation/quality/duration/timing/severity/associated sxs/prior Treatment) Patient is a 33 y.o. female presenting with vomiting.  Emesis Severity:  Moderate Duration:  1 day Timing:  Constant Progression:  Unchanged Chronicity:  New Recent urination:  Normal Relieved by:  Nothing Associated symptoms: headaches   Associated symptoms: no abdominal pain and no diarrhea     Past Medical History  Diagnosis Date  . No pertinent past medical history    Past Surgical History  Procedure Laterality Date  . Dilation and curettage of uterus  2007  . Tubal ligation  2008  . Cesarean section  2004   History reviewed. No pertinent family history. History  Substance Use Topics  . Smoking status: Current Every Day Smoker  . Smokeless tobacco: Not on file  . Alcohol Use: Yes   OB History   Grav Para Term Preterm Abortions TAB SAB Ect Mult Living   4 3 0  1  1   3      Review of Systems  Constitutional: Negative for activity change.  HENT: Negative for congestion.   Respiratory: Negative for cough and shortness of breath.   Cardiovascular: Negative for chest pain and leg swelling.  Gastrointestinal: Positive for nausea and vomiting. Negative for abdominal pain, diarrhea, constipation, blood in stool and abdominal distention.  Genitourinary: Negative for dysuria, flank pain and vaginal discharge.  Musculoskeletal: Positive for back pain.  Skin: Negative for color change.  Neurological: Positive for headaches. Negative for syncope.  Psychiatric/Behavioral: Negative for agitation.      Allergies  Iodine  Home Medications   Prior to Admission medications   Medication Sig Start Date End Date Taking? Authorizing Provider  ibuprofen (ADVIL,MOTRIN) 200 MG tablet Take 400 mg by mouth  every 6 (six) hours as needed. For pain   Yes Historical Provider, MD   BP 96/69  Pulse 62  Temp(Src) 98.6 F (37 C) (Oral)  Resp 20  Wt 120 lb (54.432 kg)  SpO2 100% Physical Exam  Constitutional: She is oriented to person, place, and time. She appears well-developed.  HENT:  Head: Normocephalic.  Eyes: Pupils are equal, round, and reactive to light.  Neck: Neck supple.  Cardiovascular: Normal rate.  Exam reveals no gallop and no friction rub.   No murmur heard. Pulmonary/Chest: Effort normal and breath sounds normal. No respiratory distress.  Abdominal: Soft. She exhibits no distension. There is generalized tenderness. There is no rebound.  Musculoskeletal: She exhibits no edema.  L spine point tenderness    Neurological: She is alert and oriented to person, place, and time.  Skin: Skin is warm.  Psychiatric: She has a normal mood and affect.    ED Course  Procedures (including critical care time) Labs Review Labs Reviewed  CBC WITH DIFFERENTIAL - Abnormal; Notable for the following:    RBC 3.76 (*)    Hemoglobin 11.4 (*)    HCT 34.6 (*)    All other components within normal limits  URINALYSIS, ROUTINE W REFLEX MICROSCOPIC - Abnormal; Notable for the following:    APPearance CLOUDY (*)    Leukocytes, UA LARGE (*)    All other components within normal limits  URINE MICROSCOPIC-ADD ON - Abnormal; Notable for the following:    Squamous Epithelial / LPF MANY (*)    Bacteria, UA  MANY (*)    All other components within normal limits  COMPREHENSIVE METABOLIC PANEL  LIPASE, BLOOD  PREGNANCY, URINE    Imaging Review No results found.   EKG Interpretation None      MDM   Final diagnoses:  UTI (lower urinary tract infection)   33 year old female with no significant past medical history the presents with vomiting lightheadedness since this morning. Patient also states that she is having headache and some minimal lower back pain. Also endorses frequency in  urination. Upon workup patient was found to have UTI is potentially complicated by pyelonephritis. Patient given a dose of Keflex in the department was further sent home with prescription for a seven-day course. Patient given return precautions and instructed on the importance of pulled through the UTI. Patient was symptomatically better after Phenergan and normal saline fluid. Patient was given Phenergan for her home use.    Clement SayresStaci Yarnell Kozloski, MD 10/08/13 [phone]

## 2013-10-06 NOTE — ED Notes (Signed)
Pt states "when i stand up i feel like i'm going to throw up"

## 2013-10-07 MED ORDER — PROMETHAZINE HCL 25 MG PO TABS: 12.5000 mg | ORAL_TABLET | Freq: Four times a day (QID) | ORAL | Status: DC | PRN

## 2013-10-10 NOTE — ED Provider Notes (Signed)
I saw and evaluated the patient, reviewed the resident's note and I agree with the findings and plan.   EKG Interpretation None       Patient with vomiting, likely from UTI. No vomiting here, will d/c with anti-emetics and antibiotics.   Audree CamelScott T Mattheo Swindle, MD 10/10/13 1409

## 2014-01-16 ENCOUNTER — Encounter (HOSPITAL_COMMUNITY): Payer: Self-pay | Admitting: Emergency Medicine

## 2014-07-03 ENCOUNTER — Emergency Department (HOSPITAL_COMMUNITY)
Admission: EM | Admit: 2014-07-03 | Discharge: 2014-07-04 | Disposition: A | Discharge status: Home / Self Care | Payer: Self-pay | Attending: Emergency Medicine | Admitting: Emergency Medicine

## 2014-07-03 ENCOUNTER — Encounter (HOSPITAL_COMMUNITY): Payer: Self-pay | Admitting: Adult Health

## 2014-07-03 DIAGNOSIS — Z720 Tobacco use: Secondary | ICD-10-CM | POA: Insufficient documentation

## 2014-07-03 DIAGNOSIS — A599 Trichomoniasis, unspecified: Secondary | ICD-10-CM

## 2014-07-03 DIAGNOSIS — N739 Female pelvic inflammatory disease, unspecified: Secondary | ICD-10-CM | POA: Insufficient documentation

## 2014-07-03 DIAGNOSIS — M549 Dorsalgia, unspecified: Secondary | ICD-10-CM | POA: Insufficient documentation

## 2014-07-03 DIAGNOSIS — R102 Pelvic and perineal pain: Secondary | ICD-10-CM

## 2014-07-03 DIAGNOSIS — N730 Acute parametritis and pelvic cellulitis: Secondary | ICD-10-CM

## 2014-07-03 DIAGNOSIS — Z9851 Tubal ligation status: Secondary | ICD-10-CM | POA: Insufficient documentation

## 2014-07-03 DIAGNOSIS — N949 Unspecified condition associated with female genital organs and menstrual cycle: Secondary | ICD-10-CM | POA: Insufficient documentation

## 2014-07-03 DIAGNOSIS — R1030 Lower abdominal pain, unspecified: Secondary | ICD-10-CM

## 2014-07-03 DIAGNOSIS — N898 Other specified noninflammatory disorders of vagina: Secondary | ICD-10-CM

## 2014-07-03 DIAGNOSIS — R110 Nausea: Secondary | ICD-10-CM

## 2014-07-03 DIAGNOSIS — Z3202 Encounter for pregnancy test, result negative: Secondary | ICD-10-CM | POA: Insufficient documentation

## 2014-07-03 DIAGNOSIS — A5901 Trichomonal vulvovaginitis: Secondary | ICD-10-CM | POA: Insufficient documentation

## 2014-07-03 DIAGNOSIS — N390 Urinary tract infection, site not specified: Secondary | ICD-10-CM | POA: Insufficient documentation

## 2014-07-03 LAB — URINALYSIS, ROUTINE W REFLEX MICROSCOPIC
Bilirubin Urine: NEGATIVE
GLUCOSE, UA: NEGATIVE mg/dL
Hgb urine dipstick: NEGATIVE
Ketones, ur: NEGATIVE mg/dL
NITRITE: NEGATIVE
Protein, ur: 30 mg/dL — AB
Specific Gravity, Urine: 1.029 (ref 1.005–1.030)
Urobilinogen, UA: 1 mg/dL (ref 0.0–1.0)
pH: 7.5 (ref 5.0–8.0)

## 2014-07-03 LAB — URINE MICROSCOPIC-ADD ON

## 2014-07-03 LAB — I-STAT CHEM 8, ED
BUN: 10 mg/dL (ref 6–23)
CREATININE: 0.6 mg/dL (ref 0.50–1.10)
Calcium, Ion: 1.21 mmol/L (ref 1.12–1.23)
Chloride: 104 mmol/L (ref 96–112)
GLUCOSE: 87 mg/dL (ref 70–99)
HEMATOCRIT: 35 % — AB (ref 36.0–46.0)
Hemoglobin: 11.9 g/dL — ABNORMAL LOW (ref 12.0–15.0)
Potassium: 4.2 mmol/L (ref 3.5–5.1)
Sodium: 140 mmol/L (ref 135–145)
TCO2: 23 mmol/L (ref 0–100)

## 2014-07-03 LAB — POC URINE PREG, ED: PREG TEST UR: NEGATIVE

## 2014-07-03 MED ORDER — OXYCODONE-ACETAMINOPHEN 5-325 MG PO TABS
ORAL_TABLET | ORAL | Status: AC
  Filled 2014-07-03: qty 1

## 2014-07-03 MED ORDER — OXYCODONE-ACETAMINOPHEN 5-325 MG PO TABS
1.0000 | ORAL_TABLET | Freq: Once | ORAL | Status: AC
  Administered 2014-07-03: 1 via ORAL

## 2014-07-03 MED ORDER — ONDANSETRON 4 MG PO TBDP
8.0000 mg | ORAL_TABLET | Freq: Once | ORAL | Status: AC
  Administered 2014-07-03: 8 mg via ORAL

## 2014-07-03 MED ORDER — ONDANSETRON 4 MG PO TBDP
ORAL_TABLET | ORAL | Status: AC
  Filled 2014-07-03: qty 2

## 2014-07-03 NOTE — ED Notes (Signed)
Presents with lower abdominal for 2 weeks associated with pressure in the pelvis and urinary frequency. Pt also reports white vaginal discharge, denies foul odor. Reports having this before and was dx with UTI. Denies burning with urination.

## 2014-07-03 NOTE — ED Notes (Signed)
Mercedes, pa, at the bedside.  

## 2014-07-03 NOTE — ED Provider Notes (Signed)
CSN: [id number]     Arrival date & time 07/03/14  1929 History   First MD Initiated Contact with Patient 07/03/14 2312     Chief Complaint  Patient presents with  . Urinary Tract Infection  . Abdominal Pain     (Consider location/radiation/quality/duration/timing/severity/associated sxs/prior Treatment) HPI Comments: [name] is a 34 y.o. female who presents to the ED with complaints of one week of lower abdominal pain, darker malodorous urine, increased urinary frequency, and white vaginal discharge. She states that the abdominal pain is 10/10, constant, sharp and pressure-like, radiating to her lower back, improved with Motrin, and worsens with "tightening her ab muscles". Additional associated symptoms includes mild nausea. She states she felt this way when she had a UTI in the past. She denies any fevers, chills, chest pain, shortness breath, vomiting, diarrhea, constipation, melena, hematochezia, hematemesis, constipation, dysuria, hematuria, vaginal bleeding, vaginal itching, genital lesions, flank pain, cauda equina symptoms, incontinence of urine or stool, saddle anesthesia, numbness, tingling, weakness, recent travel, sick contacts, antibiotic use, suspicious food intake, or alcohol/NSAID use. LMP 06/23/14. Sexually active with 1 female partner, unprotected.  Patient is a 34 y.o. female presenting with urinary tract infection and abdominal pain. The history is provided by the patient. No language interpreter was used.  Urinary Tract Infection This is a new problem. The current episode started in the past 7 days. The problem occurs constantly. The problem has been unchanged. Associated symptoms include abdominal pain, nausea (mild) and urinary symptoms (dark malodorous urine, increased frequency). Pertinent negatives include no arthralgias, change in bowel habit, chest pain, chills, fever, myalgias, numbness, rash, vomiting or weakness. Exacerbated by: tensing abdominal muscles. She has  tried NSAIDs for the symptoms. The treatment provided moderate relief.  Abdominal Pain Pain location:  RLQ, LLQ and suprapubic Pain quality: pressure and sharp   Pain radiates to:  Back Pain severity:  Moderate Onset quality:  Gradual Duration:  1 week Timing:  Constant Progression:  Unchanged Chronicity:  New Context: not recent illness, not recent travel, not sick contacts and not suspicious food intake   Relieved by:  NSAIDs Exacerbated by: tensing her abdominal muscles. Ineffective treatments:  None tried Associated symptoms: nausea (mild) and vaginal discharge (white)   Associated symptoms: no chest pain, no chills, no constipation, no diarrhea, no dysuria, no fever, no flatus, no hematemesis, no hematochezia, no hematuria, no melena, no shortness of breath, no vaginal bleeding and no vomiting   Risk factors: no alcohol abuse and no NSAID use     Past Medical History  Diagnosis Date  . No pertinent past medical history    Past Surgical History  Procedure Laterality Date  . Dilation and curettage of uterus  2007  . Tubal ligation  2008  . Cesarean section  2004   History reviewed. No pertinent family history. History  Substance Use Topics  . Smoking status: Current Every Day Smoker  . Smokeless tobacco: Not on file  . Alcohol Use: Yes   OB History    Gravida Para Term Preterm AB TAB SAB Ectopic Multiple Living   4 3 0  Review of Systems  Constitutional: Negative for fever and chills.  Respiratory: Negative for shortness of breath.   Cardiovascular: Negative for chest pain.  Gastrointestinal: Positive for nausea (mild) and abdominal pain. Negative for vomiting, diarrhea, constipation, blood in stool, melena, hematochezia, rectal pain, change in bowel habit, flatus and hematemesis.  Genitourinary: Positive for frequency  and vaginal discharge (white). Negative for dysuria, hematuria, flank pain, vaginal bleeding and vaginal pain.       +darker malodorous  urine  Musculoskeletal: Positive for back pain (radiating from her abdomen). Negative for myalgias and arthralgias.  Skin: Negative for color change and rash.  Allergic/Immunologic: Negative for immunocompromised state.  Neurological: Negative for weakness and numbness.  Psychiatric/Behavioral: Negative for confusion.   10 Systems reviewed and are negative for acute change except as noted in the HPI.    Allergies  Shellfish allergy and Iodine  Home Medications   Prior to Admission medications   Medication Sig Start Date End Date Taking? Authorizing Provider  ibuprofen (ADVIL,MOTRIN) 200 MG tablet Take 400 mg by mouth every 6 (six) hours as needed. For pain   Yes Historical Provider, MD  OVER THE COUNTER MEDICATION Take 2 tablets by mouth daily as needed. "Pain Reliever" [employer] brand   Yes Historical Provider, MD  promethazine (PHENERGAN) 25 MG tablet Take 0.5 tablets (12.5 mg total) by mouth every 6 (six) hours as needed for nausea or vomiting. Patient not taking: Reported on 07/03/2014 10/07/13   Clement Sayres, MD   BP 119/85 mmHg  Pulse 81  Temp(Src) 98 F (36.7 C) (Oral)  Resp 18  Ht  (1.499 m)  Wt 125 lb 6 oz (56.87 kg)  BMI 25.31 kg/m2  SpO2 100%  LMP 06/23/2014 Physical Exam  Constitutional: She is oriented to person, place, and time. Vital signs are normal. She appears well-developed and well-nourished.  Non-toxic appearance. No distress.  Afebrile, nontoxic, NAD  HENT:  Head: Normocephalic and atraumatic.  Mouth/Throat: Oropharynx is clear and moist and mucous membranes are normal.  Eyes: Conjunctivae and EOM are normal. Right eye exhibits no discharge. Left eye exhibits no discharge.  Neck: Normal range of motion. Neck supple.  Cardiovascular: Normal rate, regular rhythm, normal heart sounds and intact distal pulses.  Exam reveals no gallop and no friction rub.   No murmur heard. Pulmonary/Chest: Effort normal and breath sounds normal. No respiratory distress.  She has no decreased breath sounds. She has no wheezes. She has no rhonchi. She has no rales.  Abdominal: Soft. Normal appearance and bowel sounds are normal. She exhibits no distension. There is tenderness in the right lower quadrant, suprapubic area and left lower quadrant. There is no rigidity, no rebound, no guarding, no CVA tenderness, no tenderness at McBurney's point and negative Murphy's sign.    Soft, nondistended, +BS throughout, with diffuse lower abdomen TTP bilaterally and suprapubically, no r/g/r, neg murphy's, neg mcburney's, no CVA TTP   Genitourinary: Uterus normal. Pelvic exam was performed with patient supine. There is no rash, tenderness or lesion on the right labia. There is no tenderness or lesion on the left labia. Cervix exhibits motion tenderness, discharge and friability. Right adnexum displays no mass, no tenderness and no fullness. Left adnexum displays tenderness. Left adnexum displays no mass and no fullness. No erythema or bleeding in the vagina. Vaginal discharge found.  Chaperone present for exam. No rashes, lesions, or tenderness to external genitalia. No erythema, injury, or tenderness to vaginal mucosa. Copious amount of mucoid vaginal discharge within vaginal vault. No vaginal bleeding. No adnexal masses or fullness but +L sided adnexal tenderness. +CMT, +cervical friability, and + mucoid discharge from cervical os. Uterus non-deviated, mobile, nonTTP, and without enlargement.    Musculoskeletal: Normal range of motion.  All spinal levels with FROM intact without spinous process TTP, no bony stepoffs or deformities, no paraspinous muscle TTP  or muscle spasms. Strength 5/5 in all extremities, sensation grossly intact in all extremities, negative SLR bilaterally, gait steady and nonantalgic. No overlying skin changes.   Neurological: She is alert and oriented to person, place, and time. She has normal strength. No sensory deficit.  Skin: Skin is warm, dry and intact. No  rash noted.  Psychiatric: She has a normal mood and affect.  Nursing note and vitals reviewed.   ED Course  Procedures (including critical care time) Labs Review Labs Reviewed  WET PREP, GENITAL - Abnormal; Notable for the following:    Trich, Wet Prep FEW (*)    Clue Cells Wet Prep HPF POC FEW (*)    WBC, Wet Prep HPF POC MANY (*)    All other components within normal limits  URINALYSIS, ROUTINE W REFLEX MICROSCOPIC - Abnormal; Notable for the following:    APPearance CLOUDY (*)    Protein, ur 30 (*)    Leukocytes, UA LARGE (*)    All other components within normal limits  URINE MICROSCOPIC-ADD ON - Abnormal; Notable for the following:    Squamous Epithelial / LPF MANY (*)    Bacteria, UA FEW (*)    All other components within normal limits  CBC WITH DIFFERENTIAL/PLATELET - Abnormal; Notable for the following:    RBC 3.65 (*)    Hemoglobin 11.2 (*)    HCT 34.1 (*)    All other components within normal limits  I-STAT CHEM 8, ED - Abnormal; Notable for the following:    Hemoglobin 11.9 (*)    HCT 35.0 (*)    All other components within normal limits  RPR  HIV ANTIBODY (ROUTINE TESTING)  POC URINE PREG, ED  GC/CHLAMYDIA PROBE AMP ([place])    Imaging Review No results found.   EKG Interpretation None      MDM   Final diagnoses:  Left adnexal tenderness  Lower abdominal pain  UTI (lower urinary tract infection)  Vaginal discharge  PID (acute pelvic inflammatory disease)  Nausea  Trichomonas vaginalis infection    34 y.o. female here with lower abd pain x1 wk, urinary frequency, darker malodorous urine, and white vaginal discharge. Upreg neg. U/A looks slightly contaminated but TNTC WBC and few bacteria therefore will treat as UTI since pt has symptoms. Abd exam nonperitoneal with mild lower abdominal tenderness. Will obtain pelvic exam and get basic labs. Doubt need for imaging at this time, but will reassess after pelvic exam. Given zofran and percocet in  triage. Will reassess shortly.   12:08 AM Pelvic exam reveals L adnexal tenderness, copious amount of vaginal discharge, +CMT. Will obtain u/s to r/o TOA/PID. Will give GC/CT empiric treatment, and send home with doxycycline BID for PID treatment. Pt reporting increasing pain, it's been 4hrs since last pain pill, will give vicodin now. Chem 8 and CBC w/diff unremarkable aside from mild anemia. Will reassess shortly.   12:29 AM Wet prep showing trich, will give flagyl 2g PO. Also shows clue cells and many WBC. U/S underway, will reassess shortly.   1:05 AM Pt still in ultrasound. Will sign over care to Earley Favor NP at shift change to f/up with ultrasound results. If U/S unremarkable for TOA or other acute issue, pt can be sent home with naprosyn, norco, zofran, doxy, and bactrim and f/up with women's clinic. If any emergent U/S findings, consider xfer to women's (i.e. TOA). Please see Gail's note for further documentation of care.   BP 119/85 mmHg  Pulse 81  Temp(Src)  98 F (36.7 C) (Oral)  Resp 18  Ht 4\' 11"  (1.499 m)  Wt 125 lb 6 oz (56.87 kg)  BMI 25.31 kg/m2  SpO2 100%  LMP 06/23/2014  Meds ordered this encounter  Medications  . oxyCODONE-acetaminophen (PERCOCET/ROXICET) 5-325 MG per tablet 1 tablet    Sig:   . ondansetron (ZOFRAN-ODT) disintegrating tablet 8 mg    Sig:   . OVER THE COUNTER MEDICATION    Sig: Take 2 tablets by mouth daily as needed. "Pain Reliever" [employer] brand  . ondansetron (ZOFRAN-ODT) 4 MG disintegrating tablet    Sig:     Bartholomew Crewsavis, [place]   : cabinet override  . oxyCODONE-acetaminophen (PERCOCET/ROXICET) 5-325 MG per tablet    Sig:     Bartholomew Crewsavis, Jennaya   : cabinet override  . azithromycin (ZITHROMAX) tablet 1,000 mg    Sig:    And  . cefTRIAXone (ROCEPHIN) injection 250 mg    Sig:     Order Specific Question:  Antibiotic Indication:    Answer:  STD  . HYDROcodone-acetaminophen (NORCO/VICODIN) 5-325 MG per tablet 1 tablet    Sig:   . metroNIDAZOLE  (FLAGYL) tablet 2,000 mg    Sig:   . doxycycline (VIBRAMYCIN) 100 MG capsule    Sig: Take 1 capsule (100 mg total) by mouth 2 (two) times daily. One po bid x 14 days    Dispense:  28 capsule    Refill:  0  . ondansetron (ZOFRAN) 8 MG tablet    Sig: Take 1 tablet (8 mg total) by mouth every 8 (eight) hours as needed for nausea or vomiting.    Dispense:  10 tablet    Refill:  0  . sulfamethoxazole-trimethoprim (BACTRIM DS,SEPTRA DS) 800-160 MG per tablet    Sig: Take 1 tablet by mouth 2 (two) times daily.    Dispense:  14 tablet    Refill:  0  . HYDROcodone-acetaminophen (NORCO) 5-325 MG per tablet    Sig: Take 1 tablet by mouth every 6 (six) hours as needed for severe pain.    Dispense:  6 tablet    Refill:  0  . naproxen (NAPROSYN) 500 MG tablet    Sig: Take 1 tablet (500 mg total) by mouth 2 (two) times daily as needed for mild pain, moderate pain or headache (TAKE WITH MEALS.).    Dispense:  20 tablet    Refill:  0     Sevan Mcbroom Camprubi-Soms, PA-C 07/04/14 0113  Eber HongBrian Miller, MD 07/05/14 1113

## 2014-07-04 ENCOUNTER — Emergency Department (HOSPITAL_COMMUNITY): Payer: Self-pay

## 2014-07-04 LAB — WET PREP, GENITAL: Yeast Wet Prep HPF POC: NONE SEEN

## 2014-07-04 LAB — CBC WITH DIFFERENTIAL/PLATELET
Basophils Absolute: 0 10*3/uL (ref 0.0–0.1)
Basophils Relative: 0 % (ref 0–1)
Eosinophils Absolute: 0.2 10*3/uL (ref 0.0–0.7)
Eosinophils Relative: 2 % (ref 0–5)
HCT: 34.1 % — ABNORMAL LOW (ref 36.0–46.0)
Hemoglobin: 11.2 g/dL — ABNORMAL LOW (ref 12.0–15.0)
LYMPHS PCT: 37 % (ref 12–46)
Lymphs Abs: 3.4 10*3/uL (ref 0.7–4.0)
MCH: 30.7 pg (ref 26.0–34.0)
MCHC: 32.8 g/dL (ref 30.0–36.0)
MCV: 93.4 fL (ref 78.0–100.0)
MONO ABS: 0.9 10*3/uL (ref 0.1–1.0)
Monocytes Relative: 10 % (ref 3–12)
NEUTROS ABS: 4.5 10*3/uL (ref 1.7–7.7)
NEUTROS PCT: 51 % (ref 43–77)
Platelets: 209 10*3/uL (ref 150–400)
RBC: 3.65 MIL/uL — AB (ref 3.87–5.11)
RDW: 13.5 % (ref 11.5–15.5)
WBC: 9 10*3/uL (ref 4.0–10.5)

## 2014-07-04 LAB — GC/CHLAMYDIA PROBE AMP (~~LOC~~) NOT AT ARMC
Chlamydia: POSITIVE — AB
NEISSERIA GONORRHEA: NEGATIVE

## 2014-07-04 LAB — RPR: RPR Ser Ql: NONREACTIVE

## 2014-07-04 LAB — HIV ANTIBODY (ROUTINE TESTING W REFLEX): HIV Screen 4th Generation wRfx: NONREACTIVE

## 2014-07-04 MED ORDER — HYDROCODONE-ACETAMINOPHEN 5-325 MG PO TABS: 1.0000 | ORAL_TABLET | Freq: Four times a day (QID) | ORAL | Status: DC | PRN

## 2014-07-04 MED ORDER — SULFAMETHOXAZOLE-TRIMETHOPRIM 800-160 MG PO TABS: 1.0000 | ORAL_TABLET | Freq: Two times a day (BID) | ORAL | Status: DC

## 2014-07-04 MED ORDER — LIDOCAINE HCL (PF) 1 % IJ SOLN
INTRAMUSCULAR | Status: AC
  Administered 2014-07-04: 5 mL
  Filled 2014-07-04: qty 5

## 2014-07-04 MED ORDER — NAPROXEN 500 MG PO TABS: 500.0000 mg | ORAL_TABLET | Freq: Two times a day (BID) | ORAL | Status: DC | PRN

## 2014-07-04 MED ORDER — METRONIDAZOLE 500 MG PO TABS
2000.0000 mg | ORAL_TABLET | Freq: Once | ORAL | Status: AC
  Administered 2014-07-04: 2000 mg via ORAL
  Filled 2014-07-04: qty 4

## 2014-07-04 MED ORDER — AZITHROMYCIN 250 MG PO TABS
1000.0000 mg | ORAL_TABLET | Freq: Once | ORAL | Status: AC
  Administered 2014-07-04: 1000 mg via ORAL
  Filled 2014-07-04: qty 4

## 2014-07-04 MED ORDER — DOXYCYCLINE HYCLATE 100 MG PO CAPS: 100.0000 mg | ORAL_CAPSULE | Freq: Two times a day (BID) | ORAL | Status: DC

## 2014-07-04 MED ORDER — HYDROCODONE-ACETAMINOPHEN 5-325 MG PO TABS
1.0000 | ORAL_TABLET | Freq: Once | ORAL | Status: AC
Start: 2014-07-04 — End: 2014-07-04
  Administered 2014-07-04: 1 via ORAL
  Filled 2014-07-04: qty 1

## 2014-07-04 MED ORDER — CEFTRIAXONE SODIUM 250 MG IJ SOLR
250.0000 mg | Freq: Once | INTRAMUSCULAR | Status: AC
  Administered 2014-07-04: 250 mg via INTRAMUSCULAR
  Filled 2014-07-04: qty 250

## 2014-07-04 MED ORDER — ONDANSETRON HCL 8 MG PO TABS: 8.0000 mg | ORAL_TABLET | Freq: Three times a day (TID) | ORAL | Status: DC | PRN

## 2014-07-04 NOTE — Discharge Instructions (Signed)
Follow up with [facility] STD clinic for future STD concerns or screenings. This is the recommendation by the CDC for people with multiple sexual partners or hx of STDs. You have been treated for gonorrhea and chlamydia in the ER but the hospital will call you if lab is positive. You were tested for HIV and Syphilis, and the hospital will call you if the lab is positive. You were treated for trichomonas because this was found in your vaginal swab, you will need all sexual partners to be tested and treated. Use zofran as needed for nausea. Take doxycycline as directed for pelvic inflammatory disease, and avoid direct sunlight while taking this medication. Use naprosyn and norco as directed as needed for pain, but don't drive while taking norco. Stay very well hydrated with plenty of water throughout the day. Take Bactrim until completed for your urinary tract infection. Follow up with women's outpatient clinic in 1 week for recheck of ongoing symptoms but return to ER for emergent changing or worsening of symptoms. Please seek immediate care if you develop the following: You develop back pain.  Your symptoms are no better, or worse in 3 days. There is severe back pain or lower abdominal pain.  You develop chills.  You have a fever.  There is nausea or vomiting.  There is continued burning or discomfort with urination.     Abdominal Pain Many things can cause belly (abdominal) pain. Most times, the belly pain is not dangerous. Many cases of belly pain can be watched and treated at home. HOME CARE   Do not take medicines that help you go poop (laxatives) unless told to by your doctor.  Only take medicine as told by your doctor.  Eat or drink as told by your doctor. Your doctor will tell you if you should be on a special diet. GET HELP IF:  You do not know what is causing your belly pain.  You have belly pain while you are sick to your stomach (nauseous) or have runny poop  (diarrhea).  You have pain while you pee or poop.  Your belly pain wakes you up at night.  You have belly pain that gets worse or better when you eat.  You have belly pain that gets worse when you eat fatty foods.  You have a fever. GET HELP RIGHT AWAY IF:   The pain does not go away within 2 hours.  You keep throwing up (vomiting).  The pain changes and is only in the right or left part of the belly.  You have bloody or tarry looking poop. MAKE SURE YOU:   Understand these instructions.  Will watch your condition.  Will get help right away if you are not doing well or get worse. Document Released: 08/20/2007 Document Revised: 03/08/2013 Document Reviewed: 11/10/2012 [facility] Patient Information 2015 [place], [place]. This information is not intended to replace advice given to you by your health care provider. Make sure you discuss any questions you have with your health care provider.  Nausea, Adult Nausea is the feeling that you have an upset stomach or have to vomit. Nausea by itself is not likely a serious concern, but it may be an early sign of more serious medical problems. As nausea gets worse, it can lead to vomiting. If vomiting develops, there is the risk of dehydration.  CAUSES   Viral infections.  Food poisoning.  Medicines.  Pregnancy.  Motion sickness.  Migraine headaches.  Emotional distress.  Severe pain  from any source.  Alcohol intoxication. HOME CARE INSTRUCTIONS  Get plenty of rest.  Ask your caregiver about specific rehydration instructions.  Eat small amounts of food and sip liquids more often.  Take all medicines as told by your caregiver. SEEK MEDICAL CARE IF:  You have not improved after 2 days, or you get worse.  You have a headache. SEEK IMMEDIATE MEDICAL CARE IF:   You have a fever.  You faint.  You keep vomiting or have blood in your vomit.  You are extremely weak or dehydrated.  You have dark or bloody  stools.  You have severe chest or abdominal pain. MAKE SURE YOU:  Understand these instructions.  Will watch your condition.  Will get help right away if you are not doing well or get worse. Document Released: 04/10/2004 Document Revised: 11/26/2011 Document Reviewed: 11/13/2010 [facility] Patient Information 2015 [place], [place]. This information is not intended to replace advice given to you by your health care provider. Make sure you discuss any questions you have with your health care provider.  Pelvic Inflammatory Disease Pelvic inflammatory disease (PID) is an infection in some or all of the female organs. PID can be in the uterus, ovaries, fallopian tubes, or the surrounding tissues inside the lower belly area (pelvis). HOME CARE   If given, take your antibiotic medicine as told. Finish them even if you start to feel better.  Only take medicine as told by your doctor.  Do not have sex (intercourse) until treatment is done or as told by your doctor.  Tell your sex partner if you have PID. Your partner may need to be treated.  Keep all doctor visits. GET HELP RIGHT AWAY IF:   You have a fever.  You have more belly (abdominal) or lower belly pain.  You have chills.  You have pain when you pee (urinate).  You are not better after 72 hours.  You have more fluid (discharge) coming from your vagina or fluid that is not normal.  You need pain medicine from your doctor.  You throw up (vomit).  You cannot take your medicines.  Your partner has a sexually transmitted disease (STD). MAKE SURE YOU:   Understand these instructions.  Will watch your condition.  Will get help right away if you are not doing well or get worse. Document Released: 05/30/2008 Document Revised: 06/28/2012 Document Reviewed: 02/27/2011 [facility] Patient Information 2015 [place], [place]. This information is not intended to replace advice given to you by your health care provider. Make sure you  discuss any questions you have with your health care provider.  Sexually Transmitted Disease A sexually transmitted disease (STD) is a disease or infection often passed to another person during sex. However, STDs can be passed through nonsexual ways. An STD can be passed through:  Spit (saliva).  Semen.  Blood.  Mucus from the vagina.  Pee (urine). HOW CAN I LESSEN MY CHANCES OF GETTING AN STD?  Use:  Latex condoms.  Water-soluble lubricants with condoms. Do not use petroleum jelly or oils.  Dental dams. These are small pieces of latex that are used as a barrier during oral sex.  Avoid having more than one sex partner.  Do not have sex with someone who has other sex partners.  Do not have sex with anyone you do not know or who is at high risk for an STD.  Avoid risky sex that can break your skin.  Do not have sex if you have open sores on your  mouth or skin.  Avoid drinking too much alcohol or taking illegal drugs. Alcohol and drugs can affect your good judgment.  Avoid oral and anal sex acts.  Get shots (vaccines) for HPV and hepatitis.  If you are at risk of being infected with HIV, it is advised that you take a certain medicine daily to prevent HIV infection. This is called pre-exposure prophylaxis (PrEP). You may be at risk if:  You are a man who has sex with other men (MSM).  You are attracted to the opposite sex (heterosexual) and are having sex with more than one partner.  You take drugs with a needle.  You have sex with someone who has HIV.  Talk with your doctor about if you are at high risk of being infected with HIV. If you begin to take PrEP, get tested for HIV first. Get tested every 3 months for as long as you are taking PrEP. WHAT SHOULD I DO IF I THINK I HAVE AN STD?  See your doctor.  Tell your sex partner(s) that you have an STD. They should be tested and treated.  Do not have sex until your doctor says it is okay. WHEN SHOULD I GET  HELP? Get help right away if:  You have bad belly (abdominal) pain.  You are a man and have puffiness (swelling) or pain in your testicles.  You are a woman and have puffiness in your vagina. Document Released: 04/10/2004 Document Revised: 03/08/2013 Document Reviewed: 08/27/2012 [facility] Patient Information 2015 [place]. This information is not intended to replace advice given to you by your health care provider. Make sure you discuss any questions you have with your health care provider.  Trichomoniasis Trichomoniasis is an infection caused by an organism called Trichomonas. The infection can affect both women and men. In women, the outer female genitalia and the vagina are affected. In men, the penis is mainly affected, but the prostate and other reproductive organs can also be involved. Trichomoniasis is a sexually transmitted infection (STI) and is most often passed to another person through sexual contact.  RISK FACTORS  Having unprotected sexual intercourse.  Having sexual intercourse with an infected partner. SIGNS AND SYMPTOMS  Symptoms of trichomoniasis in women include:  Abnormal gray-green frothy vaginal discharge.  Itching and irritation of the vagina.  Itching and irritation of the area outside the vagina. Symptoms of trichomoniasis in men include:   Penile discharge with or without pain.  Pain during urination. This results from inflammation of the urethra. DIAGNOSIS  Trichomoniasis may be found during a Pap test or physical exam. Your health care provider may use one of the following methods to help diagnose this infection:  Examining vaginal discharge under a microscope. For men, urethral discharge would be examined.  Testing the pH of the vagina with a test tape.  Using a vaginal swab test that checks for the Trichomonas organism. A test is available that provides results within a few minutes.  Doing a culture test for the organism. This is not  usually needed. TREATMENT   You may be given medicine to fight the infection. Women should inform their health care provider if they could be or are pregnant. Some medicines used to treat the infection should not be taken during pregnancy.  Your health care provider may recommend over-the-counter medicines or creams to decrease itching or irritation.  Your sexual partner will need to be treated if infected. HOME CARE INSTRUCTIONS   Take medicines only as directed by your  health care provider.  Take over-the-counter medicine for itching or irritation as directed by your health care provider.  Do not have sexual intercourse while you have the infection.  Women should not douche or wear tampons while they have the infection.  Discuss your infection with your partner. Your partner may have gotten the infection from you, or you may have gotten it from your partner.  Have your sex partner get examined and treated if necessary.  Practice safe, informed, and protected sex.  See your health care provider for other STI testing. SEEK MEDICAL CARE IF:   You still have symptoms after you finish your medicine.  You develop abdominal pain.  You have pain when you urinate.  You have bleeding after sexual intercourse.  You develop a rash.  Your medicine makes you sick or makes you throw up (vomit). MAKE SURE YOU:  Understand these instructions.  Will watch your condition.  Will get help right away if you are not doing well or get worse. Document Released: 08/27/2000 Document Revised: 07/18/2013 Document Reviewed: 12/13/2012 [facility] Patient Information 2015 [place], [place]. This information is not intended to replace advice given to you by your health care provider. Make sure you discuss any questions you have with your health care provider.

## 2014-07-05 ENCOUNTER — Telehealth (HOSPITAL_BASED_OUTPATIENT_CLINIC_OR_DEPARTMENT_OTHER): Payer: Self-pay | Admitting: Emergency Medicine

## 2014-07-05 NOTE — Telephone Encounter (Signed)
Positive Chlamydia  ID verified, patient notiified of positive Chlamydia and that treatment was given while in ED with Rocephin and Zithromax. STD instructions given, patient verbalized understanding.

## 2014-07-05 NOTE — Telephone Encounter (Signed)
Positive Chlamyida Treated with Zithromax and Rocephin per protocol MD [facility] faxed.  07/05/14 left messge for patient to return called to office #

## 2015-07-05 ENCOUNTER — Encounter (HOSPITAL_COMMUNITY): Payer: Self-pay | Admitting: Emergency Medicine

## 2015-07-05 ENCOUNTER — Emergency Department (HOSPITAL_COMMUNITY): Payer: Self-pay

## 2015-07-05 ENCOUNTER — Emergency Department (HOSPITAL_COMMUNITY)
Admission: EM | Admit: 2015-07-05 | Discharge: 2015-07-05 | Disposition: A | Discharge status: Home / Self Care | Payer: Self-pay | Attending: Emergency Medicine | Admitting: Emergency Medicine

## 2015-07-05 DIAGNOSIS — R519 Headache, unspecified: Secondary | ICD-10-CM

## 2015-07-05 DIAGNOSIS — R51 Headache: Secondary | ICD-10-CM | POA: Insufficient documentation

## 2015-07-05 DIAGNOSIS — Z792 Long term (current) use of antibiotics: Secondary | ICD-10-CM | POA: Insufficient documentation

## 2015-07-05 DIAGNOSIS — F17210 Nicotine dependence, cigarettes, uncomplicated: Secondary | ICD-10-CM | POA: Insufficient documentation

## 2015-07-05 DIAGNOSIS — R42 Dizziness and giddiness: Secondary | ICD-10-CM | POA: Insufficient documentation

## 2015-07-05 MED ORDER — KETOROLAC TROMETHAMINE 30 MG/ML IJ SOLN
30.0000 mg | Freq: Once | INTRAMUSCULAR | Status: AC
  Administered 2015-07-05: 30 mg via INTRAVENOUS
  Filled 2015-07-05: qty 1

## 2015-07-05 MED ORDER — METOCLOPRAMIDE HCL 5 MG/ML IJ SOLN
10.0000 mg | Freq: Once | INTRAMUSCULAR | Status: AC
  Administered 2015-07-05: 10 mg via INTRAVENOUS
  Filled 2015-07-05: qty 2

## 2015-07-05 MED ORDER — ONDANSETRON HCL 4 MG PO TABS: 4.0000 mg | ORAL_TABLET | Freq: Four times a day (QID) | ORAL | Status: DC

## 2015-07-05 MED ORDER — SODIUM CHLORIDE 0.9 % IV BOLUS (SEPSIS)
1000.0000 mL | Freq: Once | INTRAVENOUS | Status: AC
  Administered 2015-07-05: 1000 mL via INTRAVENOUS

## 2015-07-05 MED ORDER — DIPHENHYDRAMINE HCL 50 MG/ML IJ SOLN
25.0000 mg | Freq: Once | INTRAMUSCULAR | Status: AC
  Administered 2015-07-05: 25 mg via INTRAVENOUS
  Filled 2015-07-05: qty 1

## 2015-07-05 NOTE — ED Provider Notes (Signed)
CSN: [id number]     Arrival date & time 07/05/15  7829 History   First MD Initiated Contact with Patient 07/05/15 0902     Chief Complaint  Patient presents with  . Headache    HPI Comments: 35 year old female presents with a HA for the past 2 days. She states it started all of a sudden. It is constant, throbbing, and located on her bilaterally over her temporal region and occipital region. Reports associated dizziness when walking, photophobia, phonophobia. Reports a hx of headaches but says she's never had one this severe before. No fever, chills, LOC, trauma. She has taken Tylenol and Midol with no relief.  Patient is a 35 y.o. female presenting with headaches.  Headache Associated symptoms: dizziness   Associated symptoms: no fever, no numbness, no seizures and no weakness     Past Medical History  Diagnosis Date  . No pertinent past medical history    Past Surgical History  Procedure Laterality Date  . Dilation and curettage of uterus  2007  . Tubal ligation  2008  . Cesarean section  2004   No family history on file. Social History  Substance Use Topics  . Smoking status: Current Every Day Smoker    Types: Cigars  . Smokeless tobacco: None  . Alcohol Use: Yes   OB History    Gravida Para Term Preterm AB TAB SAB Ectopic Multiple Living   4 3 0  Review of Systems  Constitutional: Negative for fever and chills.  Neurological: Positive for dizziness and headaches. Negative for tremors, seizures, syncope, speech difficulty, weakness, light-headedness and numbness.  All other systems reviewed and are negative.     Allergies  Shellfish allergy and Iodine  Home Medications   Prior to Admission medications   Medication Sig Start Date End Date Taking? Authorizing Provider  doxycycline (VIBRAMYCIN) 100 MG capsule Take 1 capsule (100 mg total) by mouth 2 (two) times daily. One po bid x 14 days 07/04/14   Mercedes Camprubi-Soms, PA-C   HYDROcodone-acetaminophen (NORCO) 5-325 MG per tablet Take 1 tablet by mouth every 6 (six) hours as needed for severe pain. 07/04/14   Mercedes Camprubi-Soms, PA-C  ibuprofen (ADVIL,MOTRIN) 200 MG tablet Take 400 mg by mouth every 6 (six) hours as needed. For pain    Historical Provider, MD  naproxen (NAPROSYN) 500 MG tablet Take 1 tablet (500 mg total) by mouth 2 (two) times daily as needed for mild pain, moderate pain or headache (TAKE WITH MEALS.). 07/04/14   Mercedes Camprubi-Soms, PA-C  ondansetron (ZOFRAN) 8 MG tablet Take 1 tablet (8 mg total) by mouth every 8 (eight) hours as needed for nausea or vomiting. 07/04/14   Mercedes Camprubi-Soms, PA-C  OVER THE COUNTER MEDICATION Take 2 tablets by mouth daily as needed. "Pain Reliever" [employer]    Historical Provider, MD  promethazine (PHENERGAN) 25 MG tablet Take 0.5 tablets (12.5 mg total) by mouth every 6 (six) hours as needed for nausea or vomiting. Patient not taking: Reported on 07/03/2014 10/07/13   Clement Sayres, MD  sulfamethoxazole-trimethoprim (BACTRIM DS,SEPTRA DS) 800-160 MG per tablet Take 1 tablet by mouth 2 (two) times daily. 07/04/14   Mercedes Camprubi-Soms, PA-C   BP 116/90 mmHg  Pulse 70  Temp(Src) 98 F (36.7 C) (Oral)  Resp 20  Ht  (1.499 m)  Wt 64.411 kg  BMI 28.67 kg/m2  SpO2 99%  LMP 06/29/2015 Physical Exam  Constitutional: She  is oriented to person, place, and time. She appears well-developed and well-nourished. No distress.  Patient has head covered and room is dark  HENT:  Head: Normocephalic and atraumatic.  Eyes: Conjunctivae are normal. Pupils are equal, round, and reactive to light. Right eye exhibits no discharge. Left eye exhibits no discharge. No scleral icterus.  Neck: Normal range of motion.  Cardiovascular: Normal rate and regular rhythm.  Exam reveals no gallop and no friction rub.   No murmur heard. Pulmonary/Chest: Effort normal and breath sounds normal. No respiratory distress. She has  no wheezes. She has no rales. She exhibits no tenderness.  Abdominal: Soft. There is no tenderness.  Neurological: She is alert and oriented to person, place, and time.  Mental Status:  Alert, oriented, thought content appropriate, able to give a coherent history. Speech fluent without evidence of aphasia. Able to follow 2 step commands without difficulty.  Cranial Nerves:  II:  Peripheral visual fields grossly normal, pupils equal, round, reactive to light. Sensitivity to light noted. III,IV, VI: ptosis not present, extra-ocular motions intact bilaterally  V,VII: smile symmetric, facial light touch sensation equal VIII: hearing grossly normal to voice  X: uvula elevates symmetrically  XI: bilateral shoulder shrug symmetric and strong XII: midline tongue extension without fassiculations Motor:  Normal tone. 5/5 in upper and lower extremities bilaterally including strong and equal grip strength and dorsiflexion/plantar flexion Sensory: Light touch normal in all extremities.  Cerebellar: normal finger-to-nose with bilateral upper extremities Gait: normal gait and balance     Skin: Skin is warm and dry.  Psychiatric: She has a normal mood and affect.    ED Course  Procedures (including critical care time) Labs Review Labs Reviewed - No data to display  Imaging Review Ct Head Wo Contrast  07/05/2015  CLINICAL DATA:  Headache for 2 days with no known trauma EXAM: CT HEAD WITHOUT CONTRAST TECHNIQUE: Contiguous axial images were obtained from the base of the skull through the vertex without intravenous contrast. COMPARISON:  None. FINDINGS: No mass lesion. No midline shift. No acute hemorrhage or hematoma. No extra-axial fluid collections. No evidence of acute infarction. Calvarium intact. IMPRESSION: Normal head CT Electronically Signed   By: Esperanza Heiraymond  Rubner M.D.   On: 07/05/2015 12:05   I have personally reviewed and evaluated these images and lab results as part of my medical  decision-making.   EKG Interpretation None     Meds given in ED:  Medications  ketorolac (TORADOL) 30 MG/ML injection 30 mg (30 mg Intravenous Given 07/05/15 0935)  diphenhydrAMINE (BENADRYL) injection 25 mg (25 mg Intravenous Given 07/05/15 0935)  metoCLOPramide (REGLAN) injection 10 mg (10 mg Intravenous Given 07/05/15 0935)  sodium chloride 0.9 % bolus 1,000 mL (0 mLs Intravenous Stopped 07/05/15 1201)    Discharge Medication List as of 07/05/2015  1:03 PM       MDM   Final diagnoses:  Acute nonintractable headache, unspecified headache type   35 year old female who presents with a HA. She has a hx of HA but there are some concerning features even though she is young and seems low risk. She's never had a HA like this before, it's the worst she's ever had, and started suddenly.  Migraine cocktail given. Patient expresses no relief from this. CT of head obtained which was negative. Pt informed of results. After recheck patient states her HA is slightly better. It was a 10/10 now 6/10. Advised NSAIDs upon discharge. Return precautions given. Patient informed of clinical course, understands  medical decision-making process, and agrees with plan.    Bethel Born, PA-C 07/06/15 1303  Leta Baptist, MD 07/11/15 [phone]

## 2015-07-05 NOTE — ED Notes (Signed)
Patient states headache "to back of head and side of head".  Patient states she has been nauseated, but denies vomiting.   Patient states "light and noise hurt me".   Patient states "i took tylenol and midol at home, but it didn't help".

## 2015-07-05 NOTE — Discharge Instructions (Signed)

## 2016-04-21 ENCOUNTER — Emergency Department (HOSPITAL_COMMUNITY)
Admission: EM | Admit: 2016-04-21 | Discharge: 2016-04-21 | Disposition: A | Discharge status: Home / Self Care | Payer: BLUE CROSS/BLUE SHIELD | Attending: Physician Assistant | Admitting: Physician Assistant

## 2016-04-21 ENCOUNTER — Encounter (HOSPITAL_COMMUNITY): Payer: Self-pay

## 2016-04-21 DIAGNOSIS — S30861A Insect bite (nonvenomous) of abdominal wall, initial encounter: Secondary | ICD-10-CM | POA: Insufficient documentation

## 2016-04-21 DIAGNOSIS — Y9259 Other trade areas as the place of occurrence of the external cause: Secondary | ICD-10-CM | POA: Diagnosis not present

## 2016-04-21 DIAGNOSIS — W57XXXA Bitten or stung by nonvenomous insect and other nonvenomous arthropods, initial encounter: Secondary | ICD-10-CM | POA: Diagnosis not present

## 2016-04-21 DIAGNOSIS — F17290 Nicotine dependence, other tobacco product, uncomplicated: Secondary | ICD-10-CM | POA: Insufficient documentation

## 2016-04-21 DIAGNOSIS — Y939 Activity, unspecified: Secondary | ICD-10-CM | POA: Diagnosis not present

## 2016-04-21 DIAGNOSIS — S40861A Insect bite (nonvenomous) of right upper arm, initial encounter: Secondary | ICD-10-CM | POA: Diagnosis not present

## 2016-04-21 DIAGNOSIS — R21 Rash and other nonspecific skin eruption: Secondary | ICD-10-CM

## 2016-04-21 DIAGNOSIS — Y999 Unspecified external cause status: Secondary | ICD-10-CM | POA: Diagnosis not present

## 2016-04-21 DIAGNOSIS — S70261A Insect bite (nonvenomous), right hip, initial encounter: Secondary | ICD-10-CM | POA: Insufficient documentation

## 2016-04-21 MED ORDER — HYDROXYZINE HCL 25 MG PO TABS: 25.0000 mg | ORAL_TABLET | Freq: Four times a day (QID) | ORAL | 0 refills | Status: DC | PRN

## 2016-04-21 MED ORDER — PREDNISONE 10 MG PO TABS: 20.0000 mg | ORAL_TABLET | Freq: Two times a day (BID) | ORAL | 0 refills | Status: DC

## 2016-04-21 NOTE — ED Notes (Signed)
Pt. Changed into blue scrubs and pt.s clothes placed in bags

## 2016-04-21 NOTE — ED Provider Notes (Signed)
[facility]PT Provider Note   CSN: [id number] Arrival date & time: 04/21/16  1508  By signing my name below, I, Talbert NanPaul Grant, attest that this documentation has been prepared under the direction and in the presence of Kerrie BuffaloHope Savonna Birchmeier, NP. Electronically Signed: Talbert NanPaul Grant, Scribe. 04/21/16. 6:06 PM.   History   Chief Complaint Chief Complaint  Patient presents with  . Rash    HPI [name] is a 36 y.o. female who presents to the Emergency Department complaining of an acute onset, diffuse, raised bumpy, itchy rash covering her right side and arm that appeared on 04/20/16 after having contact with insects. After staying in a hotel overnight on 04/19/16. Pt denies SOB, coughing, or chest tightness.     The history is provided by the patient. No language interpreter was used.    Past Medical History:  Diagnosis Date  . No pertinent past medical history     There are no active problems to display for this patient.   Past Surgical History:  Procedure Laterality Date  . CESAREAN SECTION  2004  . DILATION AND CURETTAGE OF UTERUS  2007  . TUBAL LIGATION  2008    OB History    Gravida Para Term Preterm AB Living   4 3 0   1 3   SAB TAB Ectopic Multiple Live Births   1               Home Medications    Prior to Admission medications   Medication Sig Start Date End Date Taking? Authorizing Provider  doxycycline (VIBRAMYCIN) 100 MG capsule Take 1 capsule (100 mg total) by mouth 2 (two) times daily. One po bid x 14 days 07/04/14   Mercedes Street, PA-C  HYDROcodone-acetaminophen (NORCO) 5-325 MG per tablet Take 1 tablet by mouth every 6 (six) hours as needed for severe pain. 07/04/14   Mercedes Street, PA-C  hydrOXYzine (ATARAX/VISTARIL) 25 MG tablet Take 1 tablet (25 mg total) by mouth every 6 (six) hours as needed for itching. 04/21/16   Dayven Linsley Orlene OchM Regie Bunner, NP  ibuprofen (ADVIL,MOTRIN) 200 MG tablet Take 400 mg by mouth every 6 (six) hours as needed. For pain    Historical  Provider, MD  naproxen (NAPROSYN) 500 MG tablet Take 1 tablet (500 mg total) by mouth 2 (two) times daily as needed for mild pain, moderate pain or headache (TAKE WITH MEALS.). 07/04/14   Mercedes Street, PA-C  ondansetron (ZOFRAN) 4 MG tablet Take 1 tablet (4 mg total) by mouth every 6 (six) hours. 07/05/15   Bethel BornKelly Marie Gekas, PA-C  predniSONE (DELTASONE) 10 MG tablet Take 2 tablets (20 mg total) by mouth 2 (two) times daily with a meal. 04/21/16   Latiffany Harwick Orlene OchM Butch Otterson, NP  sulfamethoxazole-trimethoprim (BACTRIM DS,SEPTRA DS) 800-160 MG per tablet Take 1 tablet by mouth 2 (two) times daily. 07/04/14   Mercedes Street, PA-C    Family History No family history on file.  Social History Social History  Substance Use Topics  . Smoking status: Current Every Day Smoker    Types: Cigars  . Smokeless tobacco: Never Used  . Alcohol use Yes     Allergies   Shellfish allergy and Iodine   Review of Systems Review of Systems  Constitutional: Negative for fever.  Respiratory: Negative for cough, chest tightness and shortness of breath.   Skin: Positive for rash.     Physical Exam Updated Vital Signs BP 121/68 (BP Location: Right Arm)   Pulse 70   Temp 98.5  F (36.9 C) (Oral)   Resp 16   Ht 4\' 11"  (1.499 m)   LMP 03/31/2016   SpO2 100%   Physical Exam  Constitutional: She is oriented to person, place, and time. She appears well-developed and well-nourished.  HENT:  Head: Normocephalic and atraumatic.  Neck:  Uvula midline.  Cardiovascular: Normal rate, regular rhythm and normal heart sounds.   Pulmonary/Chest: Effort normal and breath sounds normal.  Lymphadenopathy:    She has no cervical adenopathy.  Neurological: She is alert and oriented to person, place, and time.  Skin: Skin is warm and dry.  Multiple raised red area to right side of abdomen and the right hip, right upper arm consistent with insect bites.   Psychiatric: She has a normal mood and affect.  Nursing note and vitals  reviewed.    ED Treatments / Results   DIAGNOSTIC STUDIES: Oxygen Saturation is 100% on room air, normal by my interpretation.    COORDINATION OF CARE: 5:24 PM Discussed treatment plan with pt at bedside and pt agreed to plan.  Radiology No results found.  Procedures Procedures (including critical care time)  Medications Ordered in ED Medications - No data to display   Initial Impression / Assessment and Plan / ED Course  I have reviewed the triage vital signs and the nursing notes.   Final Clinical Impressions(s) / ED Diagnoses   Final diagnoses:  Insect bite, initial encounter  Rash    New Prescriptions Discharge Medication List as of 04/21/2016  6:13 PM    I personally performed the services described in this documentation, which was scribed in my presence. The recorded information has been reviewed and is accurate.    [place], NP 04/25/16 0001    Courteney Randall An, MD 04/28/16 1610

## 2016-04-21 NOTE — ED Triage Notes (Signed)
Per Pt, Pt is coming from [facility] with a rash noted to her right abdomen after she killed a bug in the bed. Pt reports it is getting better, but it is still painful.

## 2016-04-24 ENCOUNTER — Encounter (HOSPITAL_COMMUNITY): Payer: Self-pay | Admitting: Emergency Medicine

## 2016-04-24 DIAGNOSIS — Y999 Unspecified external cause status: Secondary | ICD-10-CM | POA: Diagnosis not present

## 2016-04-24 DIAGNOSIS — F17290 Nicotine dependence, other tobacco product, uncomplicated: Secondary | ICD-10-CM | POA: Diagnosis not present

## 2016-04-24 DIAGNOSIS — Z79899 Other long term (current) drug therapy: Secondary | ICD-10-CM | POA: Insufficient documentation

## 2016-04-24 DIAGNOSIS — Y929 Unspecified place or not applicable: Secondary | ICD-10-CM | POA: Insufficient documentation

## 2016-04-24 DIAGNOSIS — Y939 Activity, unspecified: Secondary | ICD-10-CM | POA: Insufficient documentation

## 2016-04-24 DIAGNOSIS — S40861A Insect bite (nonvenomous) of right upper arm, initial encounter: Secondary | ICD-10-CM | POA: Diagnosis present

## 2016-04-24 DIAGNOSIS — W57XXXA Bitten or stung by nonvenomous insect and other nonvenomous arthropods, initial encounter: Secondary | ICD-10-CM | POA: Insufficient documentation

## 2016-04-24 NOTE — ED Notes (Signed)
Warm blankets handed out and delay explanation provided to all patients in lobby.   

## 2016-04-24 NOTE — ED Triage Notes (Signed)
Patient reports insect bite with itchy/red skin at right upper arm today.

## 2016-04-25 ENCOUNTER — Emergency Department (HOSPITAL_COMMUNITY)
Admission: EM | Admit: 2016-04-25 | Discharge: 2016-04-25 | Disposition: A | Discharge status: Home / Self Care | Payer: BLUE CROSS/BLUE SHIELD | Attending: Emergency Medicine | Admitting: Emergency Medicine

## 2016-04-25 DIAGNOSIS — W57XXXA Bitten or stung by nonvenomous insect and other nonvenomous arthropods, initial encounter: Secondary | ICD-10-CM

## 2016-04-25 NOTE — Discharge Instructions (Signed)
Continue taking your prednisone prescribed February 5, as well as the Atarax for itching, you can apply a cool compress to the area as well as take Tylenol or ibuprofen for discomfort

## 2016-04-25 NOTE — ED Provider Notes (Signed)
[facility] Provider Note   CSN: [id number] Arrival date & time: 04/24/16  2143     History   Chief Complaint Chief Complaint  Patient presents with  . Insect Bite    HPI [name] is a 36 y.o. female.  Patient was seen February 5 for insect bites on her abdomen and thorax.  She was prescribed prednisone and Vistaril at that time.  Today she noticed that she had a bite to her right upper arm that is red and painful and she just wanted it checked out to make sure it wasn't infected.      Past Medical History:  Diagnosis Date  . No pertinent past medical history     There are no active problems to display for this patient.   Past Surgical History:  Procedure Laterality Date  . CESAREAN SECTION  2004  . DILATION AND CURETTAGE OF UTERUS  2007  . TUBAL LIGATION  2008    OB History    Gravida Para Term Preterm AB Living   4 3 0   1 3   SAB TAB Ectopic Multiple Live Births   1               Home Medications    Prior to Admission medications   Medication Sig Start Date End Date Taking? Authorizing Provider  doxycycline (VIBRAMYCIN) 100 MG capsule Take 1 capsule (100 mg total) by mouth 2 (two) times daily. One po bid x 14 days 07/04/14   Mercedes Street, PA-C  HYDROcodone-acetaminophen (NORCO) 5-325 MG per tablet Take 1 tablet by mouth every 6 (six) hours as needed for severe pain. 07/04/14   Mercedes Street, PA-C  hydrOXYzine (ATARAX/VISTARIL) 25 MG tablet Take 1 tablet (25 mg total) by mouth every 6 (six) hours as needed for itching. 04/21/16   Hope Orlene Och, NP  ibuprofen (ADVIL,MOTRIN) 200 MG tablet Take 400 mg by mouth every 6 (six) hours as needed. For pain    Historical Provider, MD  naproxen (NAPROSYN) 500 MG tablet Take 1 tablet (500 mg total) by mouth 2 (two) times daily as needed for mild pain, moderate pain or headache (TAKE WITH MEALS.). 07/04/14   Mercedes Street, PA-C  ondansetron (ZOFRAN) 4 MG tablet Take 1 tablet (4 mg total) by mouth every  6 (six) hours. 07/05/15   Bethel Born, PA-C  predniSONE (DELTASONE) 10 MG tablet Take 2 tablets (20 mg total) by mouth 2 (two) times daily with a meal. 04/21/16   Hope Orlene Och, NP  sulfamethoxazole-trimethoprim (BACTRIM DS,SEPTRA DS) 800-160 MG per tablet Take 1 tablet by mouth 2 (two) times daily. 07/04/14   Mercedes Street, PA-C    Family History No family history on file.  Social History Social History  Substance Use Topics  . Smoking status: Current Every Day Smoker    Types: Cigars  . Smokeless tobacco: Never Used  . Alcohol use Yes     Allergies   Shellfish allergy and Iodine   Review of Systems Review of Systems  Constitutional: Negative for activity change and fever.  Respiratory: Negative for shortness of breath.   Cardiovascular: Negative for chest pain.  Gastrointestinal: Negative for abdominal pain.  Skin: Positive for wound.     Physical Exam Updated Vital Signs BP 114/70 (BP Location: Left Arm)   Pulse 77   Temp 98.2 F (36.8 C) (Oral)   Resp 16   Ht 4\' 11"  (1.499 m)   Wt 68 kg   LMP 03/31/2016  SpO2 100%   BMI 30.30 kg/m   Physical Exam  Constitutional: She appears well-developed and well-nourished. No distress.  Eyes: Pupils are equal, round, and reactive to light.  Neck: Normal range of motion.  Cardiovascular: Normal rate.   Pulmonary/Chest: Effort normal.  Abdominal: Soft.  Neurological: She is alert.  Skin: There is erythema.     Nursing note and vitals reviewed.    ED Treatments / Results  Labs (all labs ordered are listed, but only abnormal results are displayed) Labs Reviewed - No data to display  EKG  EKG Interpretation None       Radiology No results found.  Procedures Procedures (including critical care time)  Medications Ordered in ED Medications - No data to display   Initial Impression / Assessment and Plan / ED Course  I have reviewed the triage vital signs and the nursing notes.  Pertinent labs &  imaging results that were available during my care of the patient were reviewed by me and considered in my medical decision making (see chart for details).      She has been encouraged to continue taking the prednisone until the whole course has been completed, as well as Vistaril.  She's also been provided with an ice pack and instructed to apply this to the area several times a day for 20 minutes at a time. She can safely take Tylenol or ibuprofen for discomfort  Final Clinical Impressions(s) / ED Diagnoses   Final diagnoses:  Insect bite, initial encounter    New Prescriptions New Prescriptions   No medications on file     Earley FavorGail Biagio Snelson, NP 04/25/16 0024    Shaune Pollackameron Isaacs, MD 04/25/16 1705

## 2016-07-18 ENCOUNTER — Emergency Department (HOSPITAL_COMMUNITY): Payer: BLUE CROSS/BLUE SHIELD

## 2016-07-18 ENCOUNTER — Other Ambulatory Visit: Payer: Self-pay

## 2016-07-18 ENCOUNTER — Encounter (HOSPITAL_COMMUNITY): Payer: Self-pay | Admitting: *Deleted

## 2016-07-18 ENCOUNTER — Emergency Department (HOSPITAL_COMMUNITY)
Admission: EM | Admit: 2016-07-18 | Discharge: 2016-07-18 | Disposition: A | Discharge status: Home / Self Care | Payer: BLUE CROSS/BLUE SHIELD | Attending: Emergency Medicine | Admitting: Emergency Medicine

## 2016-07-18 DIAGNOSIS — Y9259 Other trade areas as the place of occurrence of the external cause: Secondary | ICD-10-CM | POA: Diagnosis not present

## 2016-07-18 DIAGNOSIS — F17290 Nicotine dependence, other tobacco product, uncomplicated: Secondary | ICD-10-CM | POA: Diagnosis not present

## 2016-07-18 DIAGNOSIS — X500XXA Overexertion from strenuous movement or load, initial encounter: Secondary | ICD-10-CM | POA: Diagnosis not present

## 2016-07-18 DIAGNOSIS — Y9389 Activity, other specified: Secondary | ICD-10-CM | POA: Insufficient documentation

## 2016-07-18 DIAGNOSIS — Y990 Civilian activity done for income or pay: Secondary | ICD-10-CM | POA: Insufficient documentation

## 2016-07-18 DIAGNOSIS — R0789 Other chest pain: Secondary | ICD-10-CM

## 2016-07-18 DIAGNOSIS — R079 Chest pain, unspecified: Secondary | ICD-10-CM | POA: Diagnosis present

## 2016-07-18 DIAGNOSIS — R072 Precordial pain: Secondary | ICD-10-CM

## 2016-07-18 DIAGNOSIS — Z79899 Other long term (current) drug therapy: Secondary | ICD-10-CM | POA: Insufficient documentation

## 2016-07-18 LAB — BASIC METABOLIC PANEL
Anion gap: 7 (ref 5–15)
BUN: 12 mg/dL (ref 6–20)
CO2: 24 mmol/L (ref 22–32)
CREATININE: 0.66 mg/dL (ref 0.44–1.00)
Calcium: 9 mg/dL (ref 8.9–10.3)
Chloride: 107 mmol/L (ref 101–111)
GFR calc Af Amer: >60 mL/min (ref >60)
GFR calc non Af Amer: >60 mL/min (ref >60)
Glucose, Bld: 96 mg/dL (ref 65–99)
Potassium: 3.8 mmol/L (ref 3.5–5.1)
SODIUM: 138 mmol/L (ref 135–145)

## 2016-07-18 LAB — CBC
HCT: 37.1 % (ref 36.0–46.0)
Hemoglobin: 12 g/dL (ref 12.0–15.0)
MCH: 30.1 pg (ref 26.0–34.0)
MCHC: 32.3 g/dL (ref 30.0–36.0)
MCV: 93 fL (ref 78.0–100.0)
Platelets: 194 10*3/uL (ref 150–400)
RBC: 3.99 MIL/uL (ref 3.87–5.11)
RDW: 12.9 % (ref 11.5–15.5)
WBC: 7.4 10*3/uL (ref 4.0–10.5)

## 2016-07-18 LAB — I-STAT TROPONIN, ED: Troponin i, poc: 0 ng/mL (ref 0.00–0.08)

## 2016-07-18 MED ORDER — ACETAMINOPHEN 325 MG PO TABS: 650.0000 mg | ORAL_TABLET | Freq: Once | ORAL | Status: DC

## 2016-07-18 MED ORDER — NAPROXEN 250 MG PO TABS: 250.0000 mg | ORAL_TABLET | Freq: Two times a day (BID) | ORAL | 0 refills | Status: DC

## 2016-07-18 NOTE — ED Notes (Signed)
Pt refused medication

## 2016-07-18 NOTE — ED Notes (Signed)
Pt reports chest pain that started 2 days ago while packing and moving boxes. Pt denies pain at rest but starts upon movement.

## 2016-07-18 NOTE — ED Provider Notes (Signed)
[facility] Provider Note   CSN: [id number] Arrival date & time: 07/18/16  1505     History   Chief Complaint Chief Complaint  Patient presents with  . Chest Pain    HPI [name] is a 36 y.o. female.  [name] is a 36 y.o. Female who presents to the ED complaining of left-sided chest pain for the past 2 days. Patient reports she has been doing lots of extra lifting recently and works at [employer]. She reports she developed a left-sided, nonradiating chest pain 2 days ago that is worse with movement. She reports movement of her arm makes the pain come. She tells me currently her pain is 0 because she is sitting still. Her pain worsens with movement. She has taken nothing for treatment of her symptoms today. She denies personal history of MI. She reports a mother with history of heart problems that are nonspecific at the age of 49. She is a smoker. She denies endogenous estrogen use, recent surgeries or recent long travel. She denies fevers, hemoptysis, cough, shortness of breath, palpitations, leg pain, leg swelling, abdominal pain, nausea, vomiting, lightheadedness, dizziness, syncope or rashes.   The history is provided by the patient and medical records. No language interpreter was used.    Past Medical History:  Diagnosis Date  . No pertinent past medical history     There are no active problems to display for this patient.   Past Surgical History:  Procedure Laterality Date  . CESAREAN SECTION  2004  . DILATION AND CURETTAGE OF UTERUS  2007  . TUBAL LIGATION  2008    OB History    Gravida Para Term Preterm AB Living   4 3 0   1 3   SAB TAB Ectopic Multiple Live Births   1               Home Medications    Prior to Admission medications   Medication Sig Start Date End Date Taking? Authorizing Provider  doxycycline (VIBRAMYCIN) 100 MG capsule Take 1 capsule (100 mg total) by mouth 2 (two) times daily. One po bid x 14 days 07/04/14    Mercedes Street, PA-C  HYDROcodone-acetaminophen (NORCO) 5-325 MG per tablet Take 1 tablet by mouth every 6 (six) hours as needed for severe pain. 07/04/14   Mercedes Street, PA-C  hydrOXYzine (ATARAX/VISTARIL) 25 MG tablet Take 1 tablet (25 mg total) by mouth every 6 (six) hours as needed for itching. 04/21/16   Hope Orlene Och, NP  naproxen (NAPROSYN) 250 MG tablet Take 1 tablet (250 mg total) by mouth 2 (two) times daily with a meal. 07/18/16   Everlene Farrier, PA-C  ondansetron (ZOFRAN) 4 MG tablet Take 1 tablet (4 mg total) by mouth every 6 (six) hours. 07/05/15   Bethel Born, PA-C  predniSONE (DELTASONE) 10 MG tablet Take 2 tablets (20 mg total) by mouth 2 (two) times daily with a meal. 04/21/16   Hope Orlene Och, NP  sulfamethoxazole-trimethoprim (BACTRIM DS,SEPTRA DS) 800-160 MG per tablet Take 1 tablet by mouth 2 (two) times daily. 07/04/14   Mercedes Street, PA-C    Family History No family history on file.  Social History Social History  Substance Use Topics  . Smoking status: Current Every Day Smoker    Types: Cigars  . Smokeless tobacco: Never Used  . Alcohol use Yes     Allergies   Shellfish allergy and Iodine   Review of Systems Review of Systems  Constitutional:  Negative for chills and fever.  HENT: Negative for congestion and sore throat.   Eyes: Negative for visual disturbance.  Respiratory: Negative for cough, shortness of breath and wheezing.   Cardiovascular: Positive for chest pain. Negative for palpitations and leg swelling.  Gastrointestinal: Negative for abdominal pain, nausea and vomiting.  Genitourinary: Negative for dysuria.  Musculoskeletal: Negative for back pain and neck pain.  Skin: Negative for rash.  Neurological: Negative for dizziness, syncope, weakness, light-headedness, numbness and headaches.     Physical Exam Updated Vital Signs BP 134/90   Pulse 66   Temp 98.3 F (36.8 C) (Oral)   Resp 19   Ht 4\' 11"  (1.499 m)   Wt 62.6 kg   SpO2 100%    BMI 27.87 kg/m   Physical Exam  Constitutional: She is oriented to person, place, and time. She appears well-developed and well-nourished. No distress.  Nontoxic appearing.  HENT:  Head: Normocephalic and atraumatic.  Mouth/Throat: Oropharynx is clear and moist.  Eyes: Conjunctivae are normal. Pupils are equal, round, and reactive to light. Right eye exhibits no discharge. Left eye exhibits no discharge.  Neck: Neck supple. No JVD present. No tracheal deviation present.  Cardiovascular: Normal rate, regular rhythm, normal heart sounds and intact distal pulses.  Exam reveals no gallop and no friction rub.   No murmur heard. Bilateral radial pulses are intact.  Pulmonary/Chest: Effort normal and breath sounds normal. No stridor. No respiratory distress. She has no wheezes. She has no rales. She exhibits tenderness.  Lungs are clear to ascultation bilaterally. Symmetric chest expansion bilaterally. No increased work of breathing. No rales or rhonchi.   Left sided chest wall is TTP and reproduces her chest pain. No overlying skin changes.   Abdominal: Soft. There is no tenderness. There is no guarding.  Musculoskeletal: Normal range of motion. She exhibits no edema or tenderness.  No lower extremity edema or tenderness.  Lymphadenopathy:    She has no cervical adenopathy.  Neurological: She is alert and oriented to person, place, and time. No sensory deficit. She exhibits normal muscle tone. Coordination normal.  Skin: Skin is warm and dry. Capillary refill takes less than 2 seconds. No rash noted. She is not diaphoretic. No erythema. No pallor.  Psychiatric: She has a normal mood and affect. Her behavior is normal.  Nursing note and vitals reviewed.    ED Treatments / Results  Labs (all labs ordered are listed, but only abnormal results are displayed) Labs Reviewed  BASIC METABOLIC PANEL  CBC  I-STAT TROPOININ, ED    EKG  EKG Interpretation ED ECG REPORT   Date: 07/18/2016   Rate: 78  Rhythm: normal sinus rhythm  QRS Axis: normal  Intervals: normal  ST/T Wave abnormalities: normal  Conduction Disutrbances:none  Narrative Interpretation:   Old EKG Reviewed: none available  I have personally reviewed the EKG tracing and agree with the computerized printout as noted.        Radiology Dg Chest 2 View  Result Date: 07/18/2016 CLINICAL DATA:  36 year old female with chest pain exacerbated by movement EXAM: CHEST  2 VIEW COMPARISON:  None. FINDINGS: The lungs are clear and negative for focal airspace consolidation, pulmonary edema or suspicious pulmonary nodule. No pleural effusion or pneumothorax. Cardiac and mediastinal contours are within normal limits. No acute fracture or lytic or blastic osseous lesions. The visualized upper abdominal bowel gas pattern is unremarkable. IMPRESSION: Normal chest x-ray. Electronically Signed   By: Malachy Moan M.D.   On: 07/18/2016 16:15  Procedures Procedures (including critical care time)  Medications Ordered in ED Medications  acetaminophen (TYLENOL) tablet 650 mg (not administered)     Initial Impression / Assessment and Plan / ED Course  I have reviewed the triage vital signs and the nursing notes.  Pertinent labs & imaging results that were available during my care of the patient were reviewed by me and considered in my medical decision making (see chart for details).    This is a 36 y.o. Female who presents to the ED complaining of left-sided chest pain for the past 2 days. Patient reports she has been doing lots of extra lifting recently and works at [employer]. She reports she developed a left-sided, nonradiating chest pain 2 days ago that is worse with movement. She reports movement of her arm makes the pain come. She tells me currently her pain is 0 because she is sitting still. Her pain worsens with movement. She has taken nothing for treatment of her symptoms today. She denies personal history of MI. She  reports a mother with history of heart problems that are nonspecific at the age of 35. She is a smoker. On exam patient is afebrile nontoxic appearing. Lungs clear to auscultation bilaterally. Left-sided chest wall is tender to palpation and reproduces her chest pain. No overlying skin changes. EKG shows normal sinus rhythm. No STEMI. Mayer MaskerCronin is not elevated. I see no need for delta troponin as this patient has had pain for more than 24 hours. BMP and CBC are within normal limits. Chest x-ray is unremarkable. She is PERC negative. Workup here is reassuring. Reproducible chest wall pain. Doubt ACS or PE. Will discharge with prescription for naproxen. I discussed strict and specific return precautions. I advised the patient to follow-up with their primary care provider this week. I advised the patient to return to the emergency department with new or worsening symptoms or new concerns. The patient verbalized understanding and agreement with plan.    Final Clinical Impressions(s) / ED Diagnoses   Final diagnoses:  Precordial pain  Chest wall pain    New Prescriptions New Prescriptions   NAPROXEN (NAPROSYN) 250 MG TABLET    Take 1 tablet (250 mg total) by mouth 2 (two) times daily with a meal.     Everlene FarrierWilliam Charmelle Soh, PA-C 07/18/16 2113    Doug SouSam Jacubowitz, MD 07/19/16 [id number]

## 2016-07-18 NOTE — ED Triage Notes (Signed)
To ED for eval of chest wall pain that increases with movement. Started 2 days ago. No cough or fevers noted. Pt states she takes Tylenol to sleep. No vomiting or nausea. Appears in nad

## 2017-02-02 ENCOUNTER — Encounter (HOSPITAL_COMMUNITY): Payer: Self-pay | Admitting: Emergency Medicine

## 2017-02-02 ENCOUNTER — Ambulatory Visit (HOSPITAL_COMMUNITY)
Admission: EM | Admit: 2017-02-02 | Discharge: 2017-02-02 | Disposition: A | Discharge status: Home / Self Care | Payer: Managed Care, Other (non HMO) | Attending: Emergency Medicine | Admitting: Emergency Medicine

## 2017-02-02 DIAGNOSIS — M779 Enthesopathy, unspecified: Secondary | ICD-10-CM

## 2017-02-02 DIAGNOSIS — G5601 Carpal tunnel syndrome, right upper limb: Secondary | ICD-10-CM

## 2017-02-02 DIAGNOSIS — M778 Other enthesopathies, not elsewhere classified: Secondary | ICD-10-CM

## 2017-02-02 MED ORDER — TRAMADOL HCL 50 MG PO TABS: 50.0000 mg | ORAL_TABLET | Freq: Four times a day (QID) | ORAL | 0 refills | Status: DC | PRN

## 2017-02-02 MED ORDER — DICLOFENAC SODIUM 50 MG PO TBEC: 50.0000 mg | DELAYED_RELEASE_TABLET | Freq: Two times a day (BID) | ORAL | 0 refills | Status: DC

## 2017-02-02 NOTE — ED Triage Notes (Signed)
Pt. Stated, I've had right arm pain and numbness for a week.

## 2017-02-02 NOTE — Discharge Instructions (Signed)
Ice, wear splint for about 2 weeks, longer if needed, remove periodically. Medications as directed

## 2017-02-02 NOTE — ED Provider Notes (Signed)
[facility]    CSN: [id number] Arrival date & time: 02/02/17  1723     History   Chief Complaint Chief Complaint  Patient presents with  . Arm Pain    right  . Extremity Weakness    HPI Mateo Flowymeshia Y Rhein is a 36 y.o. female.   36 year old female complaining of right wrist pain radiating into the right forearm. She states her job requires grasping, bending her wrist up and down and lots of movement repetitively during the day. Over the past week the pain has increased. Denies known trauma. She also complains of some numbness going up into the forearm.       Past Medical History:  Diagnosis Date  . No pertinent past medical history     There are no active problems to display for this patient.   Past Surgical History:  Procedure Laterality Date  . CESAREAN SECTION  2004  . DILATION AND CURETTAGE OF UTERUS  2007  . TUBAL LIGATION  2008    OB History    Gravida Para Term Preterm AB Living   4 3 0   1 3   SAB TAB Ectopic Multiple Live Births   1               Home Medications    Prior to Admission medications   Medication Sig Start Date End Date Taking? Authorizing Provider  diclofenac (VOLTAREN) 50 MG EC tablet Take 1 tablet (50 mg total) 2 (two) times daily by mouth. Take with food. 02/02/17   Hayden RasmussenMabe, Jeremyah Jelley, NP  traMADol (ULTRAM) 50 MG tablet Take 1 tablet (50 mg total) every 6 (six) hours as needed by mouth. 02/02/17   Hayden RasmussenMabe, Keala Drum, NP    Family History No family history on file.  Social History Social History   Tobacco Use  . Smoking status: Current Every Day Smoker    Types: Cigars  . Smokeless tobacco: Never Used  Substance Use Topics  . Alcohol use: Yes  . Drug use: No     Allergies   Shellfish allergy and Iodine   Review of Systems Review of Systems  Constitutional: Negative for activity change, chills and fever.  HENT: Negative.   Respiratory: Negative.   Cardiovascular: Negative.   Musculoskeletal:       As per HPI    Skin: Negative for color change, pallor and rash.  Neurological: Negative.   All other systems reviewed and are negative.    Physical Exam Triage Vital Signs ED Triage Vitals  Enc Vitals Group     BP 02/02/17 1745 122/77     Pulse Rate 02/02/17 1745 72     Resp 02/02/17 1745 17     Temp 02/02/17 1745 98.7 F (37.1 C)     Temp Source 02/02/17 1745 Oral     SpO2 02/02/17 1745 100 %     Weight 02/02/17 1749 157 lb 6 oz (71.4 kg)     Height 02/02/17 1747 4\' 11"  (1.499 m)     Head Circumference --      Peak Flow --      Pain Score 02/02/17 1747 10     Pain Loc --      Pain Edu? --      Excl. in GC? --    No data found.  Updated Vital Signs BP 122/77 (BP Location: Left Arm)   Pulse 72   Temp 98.7 F (37.1 C) (Oral)   Resp 17   Ht 4\' 11"  (  1.499 m)   Wt 157 lb 6 oz (71.4 kg)   LMP 01/31/2017   SpO2 100%   BMI 31.79 kg/m   Visual Acuity Right Eye Distance:   Left Eye Distance:   Bilateral Distance:    Right Eye Near:   Left Eye Near:    Bilateral Near:     Physical Exam  Constitutional: She is oriented to person, place, and time. She appears well-developed and well-nourished. No distress.  HENT:  Head: Normocephalic and atraumatic.  Eyes: EOM are normal. Pupils are equal, round, and reactive to light.  Neck: Normal range of motion. Neck supple.  Musculoskeletal: Normal range of motion. She exhibits tenderness. She exhibits no edema.  Right wrist without swelling or deformity. Tenderness to the flexor and extensor aspect of the wrist. Demonstrates full range of motion of the hand and digits. Positive Tinel's and positive Phalen's. Radial pulse 2+. Hand and digits with normal warmth and color. Tenderness along the tendons of the forearm and wrist.  Lymphadenopathy:    She has no cervical adenopathy.  Neurological: She is alert and oriented to person, place, and time. No cranial nerve deficit.  Skin: Skin is warm and dry.  Psychiatric: She has a normal mood and  affect.     UC Treatments / Results  Labs (all labs ordered are listed, but only abnormal results are displayed) Labs Reviewed - No data to display  EKG  EKG Interpretation None       Radiology No results found.  Procedures Procedures (including critical care time)  Medications Ordered in UC Medications - No data to display   Initial Impression / Assessment and Plan / UC Course  I have reviewed the triage vital signs and the nursing notes.  Pertinent labs & imaging results that were available during my care of the patient were reviewed by me and considered in my medical decision making (see chart for details).    Ice, wear splint for about 2 weeks, longer if needed, remove periodically. Medications as directed     Final Clinical Impressions(s) / UC Diagnoses   Final diagnoses:  Right wrist tendinitis  Tendinitis of right forearm  Carpal tunnel syndrome of right wrist    ED Discharge Orders        Ordered    diclofenac (VOLTAREN) 50 MG EC tablet  2 times daily     02/02/17 1824    traMADol (ULTRAM) 50 MG tablet  Every 6 hours PRN     02/02/17 1824       Controlled Substance Prescriptions [place] Controlled Substance Registry consulted? No   Hayden RasmussenMabe, Zaidee Rion, NP 02/02/17 [id number]

## 2017-06-30 ENCOUNTER — Encounter (HOSPITAL_COMMUNITY): Payer: Self-pay | Admitting: Emergency Medicine

## 2017-06-30 ENCOUNTER — Emergency Department (HOSPITAL_COMMUNITY)
Admission: EM | Admit: 2017-06-30 | Discharge: 2017-07-01 | Disposition: A | Discharge status: Home / Self Care | Payer: BLUE CROSS/BLUE SHIELD | Attending: Emergency Medicine | Admitting: Emergency Medicine

## 2017-06-30 ENCOUNTER — Other Ambulatory Visit: Payer: Self-pay

## 2017-06-30 DIAGNOSIS — Z79899 Other long term (current) drug therapy: Secondary | ICD-10-CM | POA: Insufficient documentation

## 2017-06-30 DIAGNOSIS — M5441 Lumbago with sciatica, right side: Secondary | ICD-10-CM | POA: Insufficient documentation

## 2017-06-30 DIAGNOSIS — M5432 Sciatica, left side: Secondary | ICD-10-CM

## 2017-06-30 DIAGNOSIS — F17210 Nicotine dependence, cigarettes, uncomplicated: Secondary | ICD-10-CM | POA: Insufficient documentation

## 2017-06-30 NOTE — ED Triage Notes (Signed)
Pt reports lower back pain that radiates to her left leg. Denies incontinence and numbness.

## 2017-07-01 LAB — POC URINE PREG, ED: PREG TEST UR: NEGATIVE

## 2017-07-01 LAB — URINALYSIS, ROUTINE W REFLEX MICROSCOPIC
Bilirubin Urine: NEGATIVE
Glucose, UA: NEGATIVE mg/dL
Ketones, ur: NEGATIVE mg/dL
NITRITE: NEGATIVE
Protein, ur: NEGATIVE mg/dL
SPECIFIC GRAVITY, URINE: 1.027 (ref 1.005–1.030)
pH: 6 (ref 5.0–8.0)

## 2017-07-01 MED ORDER — PREDNISONE 10 MG (21) PO TBPK: ORAL_TABLET | ORAL | 0 refills | Status: DC

## 2017-07-01 MED ORDER — PREDNISONE 20 MG PO TABS
60.0000 mg | ORAL_TABLET | Freq: Once | ORAL | Status: AC
  Administered 2017-07-01: 60 mg via ORAL
  Filled 2017-07-01: qty 3

## 2017-07-01 MED ORDER — METHOCARBAMOL 500 MG PO TABS: 500.0000 mg | ORAL_TABLET | Freq: Three times a day (TID) | ORAL | 0 refills | Status: DC | PRN

## 2017-07-01 MED ORDER — METHOCARBAMOL 500 MG PO TABS
500.0000 mg | ORAL_TABLET | Freq: Once | ORAL | Status: AC
  Administered 2017-07-01: 500 mg via ORAL
  Filled 2017-07-01: qty 1

## 2017-07-01 MED ORDER — IBUPROFEN 800 MG PO TABS: 800.0000 mg | ORAL_TABLET | Freq: Three times a day (TID) | ORAL | 0 refills | Status: DC | PRN

## 2017-07-01 MED ORDER — IBUPROFEN 800 MG PO TABS
800.0000 mg | ORAL_TABLET | Freq: Once | ORAL | Status: AC
  Administered 2017-07-01: 800 mg via ORAL
  Filled 2017-07-01: qty 1

## 2017-07-01 NOTE — ED Provider Notes (Signed)
TIME SEEN: 4:48 AM  CHIEF COMPLAINT: Left-sided back pain  HPI: Patient is a 37 year old female with no significant past medical history who presents with achy left-sided back pain that started several days ago that radiates into the buttock and down the posterior left leg.  No known injury.  No numbness, tingling or focal weakness.  No bowel or bladder incontinence.  No urinary retention.  Is able to ambulate but states walking does make her pain worse.  She has no history of diabetes, cancer, HIV or IV drug abuse.  No fever.  No dysuria or hematuria.  No history of kidney stones.  Pain worse with movement and palpation.  Better with rest.  Has tried tramadol at home without much relief.  ROS: See HPI Constitutional: no fever  Eyes: no drainage  ENT: no runny nose   Cardiovascular:  no chest pain  Resp: no SOB  GI: no vomiting GU: no dysuria Integumentary: no rash  Allergy: no hives  Musculoskeletal: no leg swelling  Neurological: no slurred speech ROS otherwise negative  PAST MEDICAL HISTORY/PAST SURGICAL HISTORY:  Past Medical History:  Diagnosis Date  . No pertinent past medical history     MEDICATIONS:  Prior to Admission medications   Medication Sig Start Date End Date Taking? Authorizing Provider  diclofenac (VOLTAREN) 50 MG EC tablet Take 1 tablet (50 mg total) 2 (two) times daily by mouth. Take with food. 02/02/17   Hayden RasmussenMabe, David, NP  traMADol (ULTRAM) 50 MG tablet Take 1 tablet (50 mg total) every 6 (six) hours as needed by mouth. 02/02/17   Hayden RasmussenMabe, David, NP  promethazine (PHENERGAN) 25 MG tablet Take 0.5 tablets (12.5 mg total) by mouth every 6 (six) hours as needed for nausea or vomiting. Patient not taking: Reported on 07/03/2014 10/07/13 07/05/15  Clement SayresShepard, Staci, MD    ALLERGIES:  Allergies  Allergen Reactions  . Shellfish Allergy Anaphylaxis  . Iodine Itching    SOCIAL HISTORY:  Social History   Tobacco Use  . Smoking status: Current Every Day Smoker    Types:  Cigars  . Smokeless tobacco: Never Used  Substance Use Topics  . Alcohol use: Yes    FAMILY HISTORY: No family history on file.  EXAM: BP 125/73 (BP Location: Right Arm)   Pulse 76   Temp 98.7 F (37.1 C) (Oral)   Resp 18   Ht 4\' 11"  (1.499 m)   Wt 74.8 kg (165 lb)   SpO2 100%   BMI 33.33 kg/m  CONSTITUTIONAL: Alert and oriented and responds appropriately to questions. Well-appearing; well-nourished HEAD: Normocephalic EYES: Conjunctivae clear, pupils appear equal, EOMI ENT: normal nose; moist mucous membranes NECK: Supple, no meningismus, no nuchal rigidity, no LAD  CARD: RRR; S1 and S2 appreciated; no murmurs, no clicks, no rubs, no gallops RESP: Normal chest excursion without splinting or tachypnea; breath sounds clear and equal bilaterally; no wheezes, no rhonchi, no rales, no hypoxia or respiratory distress, speaking full sentences ABD/GI: Normal bowel sounds; non-distended; soft, non-tender, no rebound, no guarding, no peritoneal signs, no hepatosplenomegaly BACK:  The back appears normal and is tender over the left lumbar paraspinal muscles without redness, warmth, ecchymosis or other lesions.  There is no midline spinal tenderness or step-off or deformity noted. There is no CVA tenderness EXT: Normal ROM in all joints; non-tender to palpation; no edema; normal capillary refill; no cyanosis, no calf tenderness or swelling    SKIN: Normal color for age and race; warm; no rash NEURO: Moves all extremities  equally, normal sensation diffusely, no saddle anesthesia, normal gait, normal speech PSYCH: The patient's mood and manner are appropriate. Grooming and personal hygiene are appropriate.  MEDICAL DECISION MAKING: Patient here with likely sciatica.  No focal neurologic deficits.  No fever.  No red flag symptoms to suggest cauda equina, epidural abscess or hematoma, discitis or transverse myelitis.  I have recommended anti-inflammatories, steroid taper and muscle relaxers.  She  is comfortable with this plan.  Will give outpatient primary care follow-up as well.  Discussed return precautions.  I do not feel she needs emergent imaging of her back.  At this time, I do not feel there is any life-threatening condition present. I have reviewed and discussed all results (EKG, imaging, lab, urine as appropriate) and exam findings with patient/family. I have reviewed nursing notes and appropriate previous records.  I feel the patient is safe to be discharged home without further emergent workup and can continue workup as an outpatient as needed. Discussed usual and customary return precautions. Patient/family verbalize understanding and are comfortable with this plan.  Outpatient follow-up has been provided if needed. All questions have been answered.      Herchel Hopkin, Layla Maw, DO 07/01/17 [phone]

## 2017-07-01 NOTE — ED Notes (Signed)
ED Provider at bedside. 

## 2017-07-01 NOTE — Discharge Instructions (Signed)
To find a primary care or specialty doctor please call [phone] or [phone] to access "[place] Find a Doctor Service." ° °You may also go on the [place] website at [email or web address] ° °There are also multiple [facility], [facility] and [facility] practices throughout the [place] that are frequently accepting new patients. You may find a clinic that is close to your home and contact them. ° °[facility] -  °[address] °[place] [address] °[phone] ° ° °[facility] -  °[address] °[place] [address] [phone] ° ° °[facility] - °[address]  °[place]  [address] [phone] ° ° °

## 2017-07-01 NOTE — ED Notes (Signed)
Pt refused ibuprofen, reported it was too big to swallow. Pt received discharge instructions. Pt verbalized understanding. Signature pad unavailable. Pt denied further needs.

## 2017-08-23 ENCOUNTER — Encounter (HOSPITAL_COMMUNITY): Payer: Self-pay

## 2017-08-23 ENCOUNTER — Emergency Department (HOSPITAL_COMMUNITY)
Admission: EM | Admit: 2017-08-23 | Discharge: 2017-08-23 | Disposition: A | Discharge status: Home / Self Care | Payer: Self-pay | Attending: Emergency Medicine | Admitting: Emergency Medicine

## 2017-08-23 DIAGNOSIS — R102 Pelvic and perineal pain: Secondary | ICD-10-CM

## 2017-08-23 DIAGNOSIS — A599 Trichomoniasis, unspecified: Secondary | ICD-10-CM | POA: Insufficient documentation

## 2017-08-23 DIAGNOSIS — Z79899 Other long term (current) drug therapy: Secondary | ICD-10-CM | POA: Insufficient documentation

## 2017-08-23 DIAGNOSIS — N72 Inflammatory disease of cervix uteri: Secondary | ICD-10-CM | POA: Insufficient documentation

## 2017-08-23 DIAGNOSIS — F17210 Nicotine dependence, cigarettes, uncomplicated: Secondary | ICD-10-CM | POA: Insufficient documentation

## 2017-08-23 LAB — URINALYSIS, ROUTINE W REFLEX MICROSCOPIC
BACTERIA UA: NONE SEEN
BILIRUBIN URINE: NEGATIVE
Glucose, UA: NEGATIVE mg/dL
Hgb urine dipstick: NEGATIVE
KETONES UR: NEGATIVE mg/dL
Nitrite: NEGATIVE
PROTEIN: NEGATIVE mg/dL
Specific Gravity, Urine: 1.019 (ref 1.005–1.030)
pH: 6 (ref 5.0–8.0)

## 2017-08-23 LAB — WET PREP, GENITAL
Clue Cells Wet Prep HPF POC: NONE SEEN
SPERM: NONE SEEN
YEAST WET PREP: NONE SEEN

## 2017-08-23 LAB — POC URINE PREG, ED: PREG TEST UR: NEGATIVE

## 2017-08-23 MED ORDER — STERILE WATER FOR INJECTION IJ SOLN
0.9000 mL | Freq: Once | INTRAMUSCULAR | Status: AC
  Administered 2017-08-23: 0.9 mL via INTRAMUSCULAR

## 2017-08-23 MED ORDER — CEFTRIAXONE SODIUM 250 MG IJ SOLR
250.0000 mg | Freq: Once | INTRAMUSCULAR | Status: AC
  Administered 2017-08-23: 250 mg via INTRAMUSCULAR
  Filled 2017-08-23: qty 250

## 2017-08-23 MED ORDER — STERILE WATER FOR INJECTION IJ SOLN
INTRAMUSCULAR | Status: AC
  Filled 2017-08-23: qty 10

## 2017-08-23 MED ORDER — AZITHROMYCIN 250 MG PO TABS
1000.0000 mg | ORAL_TABLET | Freq: Once | ORAL | Status: AC
  Administered 2017-08-23: 1000 mg via ORAL
  Filled 2017-08-23: qty 4

## 2017-08-23 MED ORDER — METRONIDAZOLE 500 MG PO TABS: 500.0000 mg | ORAL_TABLET | Freq: Two times a day (BID) | ORAL | 0 refills | Status: DC

## 2017-08-23 MED ORDER — STERILE WATER FOR INJECTION IJ SOLN: 0.9000 mL | Freq: Once | INTRAMUSCULAR | Status: DC

## 2017-08-23 NOTE — Discharge Instructions (Addendum)
Follow up with the health department for further testing and for your pap smear. Be sure your partner is treated before you have sex with him. If the cultures we did today are positive someone will call you.

## 2017-08-23 NOTE — ED Triage Notes (Signed)
Patient complains of pelvic pain/vaginal pain x 1 week. Denies dysuria, denies discharge

## 2017-08-23 NOTE — ED Provider Notes (Signed)
[facility] EMERGENCY DEPARTMENT Provider Note   CSN: [id number] Arrival date & time: 08/23/17  1058     History   Chief Complaint Chief Complaint  Patient presents with  . Pelvic Pain    HPI [name] is a 37 y.o. G4 P3103 who presents to the ED with pelvic pain and vaginal pain x 1 week. Patient reports that she had pain with trying to insert a tampon and also pain with sexual intercourse. Patient denies vaginal d/c or dysuria. She does feel pressure with urination.   The history is provided by the patient. No language interpreter was used.  Pelvic Pain  This is a new problem. The current episode started more than 2 days ago. The problem occurs constantly. Associated symptoms include abdominal pain. Pertinent negatives include no chest pain, no headaches and no shortness of breath. The symptoms are aggravated by intercourse. Nothing relieves the symptoms.    Past Medical History:  Diagnosis Date  . No pertinent past medical history     There are no active problems to display for this patient.   Past Surgical History:  Procedure Laterality Date  . CESAREAN SECTION  2004  . DILATION AND CURETTAGE OF UTERUS  2007  . TUBAL LIGATION  2008     OB History    Gravida  4   Para  3   Term  0   Preterm      AB  1   Living  3     SAB  1   TAB      Ectopic      Multiple      Live Births               Home Medications    Prior to Admission medications   Medication Sig Start Date End Date Taking? Authorizing Provider  diclofenac (VOLTAREN) 50 MG EC tablet Take 1 tablet (50 mg total) 2 (two) times daily by mouth. Take with food. 02/02/17   Hayden Rasmussen, NP  ibuprofen (ADVIL,MOTRIN) 800 MG tablet Take 1 tablet (800 mg total) by mouth every 8 (eight) hours as needed for mild pain. 07/01/17   Ward, Layla Maw, DO  methocarbamol (ROBAXIN) 500 MG tablet Take 1 tablet (500 mg total) by mouth every 8 (eight) hours as needed for muscle  spasms. 07/01/17   Ward, Layla Maw, DO  metroNIDAZOLE (FLAGYL) 500 MG tablet Take 1 tablet (500 mg total) by mouth 2 (two) times daily. 08/23/17   Janne Napoleon, NP  predniSONE (STERAPRED UNI-PAK 21 TAB) 10 MG (21) TBPK tablet Take as directed 07/01/17   Ward, Layla Maw, DO  traMADol (ULTRAM) 50 MG tablet Take 1 tablet (50 mg total) every 6 (six) hours as needed by mouth. 02/02/17   Hayden Rasmussen, NP    Family History No family history on file.  Social History Social History   Tobacco Use  . Smoking status: Current Every Day Smoker    Types: Cigars  . Smokeless tobacco: Never Used  Substance Use Topics  . Alcohol use: Yes  . Drug use: No     Allergies   Shellfish allergy and Iodine   Review of Systems Review of Systems  Constitutional: Negative for chills and fever.  HENT: Negative.   Eyes: Negative for visual disturbance.  Respiratory: Negative for cough and shortness of breath.   Cardiovascular: Negative for chest pain.  Gastrointestinal: Positive for abdominal pain. Negative for nausea and vomiting.  Genitourinary: Positive for  dyspareunia, pelvic pain and vaginal pain. Negative for dysuria, frequency, vaginal bleeding and vaginal discharge.  Musculoskeletal: Negative for back pain.  Neurological: Negative for headaches.  Psychiatric/Behavioral: Negative for confusion.     Physical Exam Updated Vital Signs BP 114/76   Pulse 78   Temp 99.1 F (37.3 C) (Oral)   Resp 18   SpO2 100%   Physical Exam  Constitutional: She appears well-developed and well-nourished. No distress.  HENT:  Head: Normocephalic and atraumatic.  Eyes: Conjunctivae and EOM are normal.  Neck: Neck supple.  Cardiovascular: Normal rate and regular rhythm.  Pulmonary/Chest: Effort normal and breath sounds normal.  Abdominal: Soft. Bowel sounds are normal. There is tenderness in the suprapubic area.  Genitourinary:  Genitourinary Comments: External genitalia without lesions, frothy d/c vaginal  vault, cervix inflamed. No CMT, no adnexal tenderness or mass palpated. Uterus without enlargement.   Musculoskeletal: Normal range of motion.  Lymphadenopathy:    She has no cervical adenopathy.  Neurological: She is alert.  Skin: Skin is warm and dry.  Psychiatric: She has a normal mood and affect. Her behavior is normal.  Nursing note and vitals reviewed.    ED Treatments / Results  Labs (all labs ordered are listed, but only abnormal results are displayed) Labs Reviewed  WET PREP, GENITAL - Abnormal; Notable for the following components:      Result Value   Trich, Wet Prep PRESENT (*)    WBC, Wet Prep HPF POC FEW (*)    All other components within normal limits  URINALYSIS, ROUTINE W REFLEX MICROSCOPIC - Abnormal; Notable for the following components:   Leukocytes, UA MODERATE (*)    All other components within normal limits  URINE CULTURE  POC URINE PREG, ED  GC[facility]DIA [facility] AMP [facility]   Radiology No results found.  Procedures Procedures (including critical care time)  Medications Ordered in ED Medications  cefTRIAXone (ROCEPHIN) injection 250 mg (has no administration in time range)  azithromycin (ZITHROMAX) tablet 1,000 mg (has no administration in time range)     Initial Impression / Assessment and Plan / ED Course  I have reviewed the triage vital signs and the nursing notes. 37 y.o. female with vaginal discharge and itching stable for d/c without fever or concern for PID. Will treat with antibiotics and encouraged patient to f/u with [facility] for additional screening and pap smear. Patient agrees with plan.  Final Clinical Impressions(s) / ED Diagnoses   Final diagnoses:  Pelvic pain  Trichomonas infection  Cervicitis    ED Discharge Orders        Ordered    metroNIDAZOLE (FLAGYL) 500 MG tablet  2 times daily     08/23/17 1403       Damian Leavelleese, [place] M, NP 08/23/17 1407    Charlynne PanderYao, David Hsienta, MD 08/25/17 [phone]

## 2017-08-24 LAB — URINE CULTURE: Culture: <10000 — AB

## 2017-08-24 LAB — GC/CHLAMYDIA PROBE AMP (~~LOC~~) NOT AT ARMC
Chlamydia: NEGATIVE
NEISSERIA GONORRHEA: POSITIVE — AB

## 2018-04-22 ENCOUNTER — Other Ambulatory Visit: Payer: Self-pay

## 2018-04-22 ENCOUNTER — Emergency Department (HOSPITAL_COMMUNITY)
Admission: EM | Admit: 2018-04-22 | Discharge: 2018-04-22 | Disposition: A | Discharge status: Home / Self Care | Payer: BLUE CROSS/BLUE SHIELD | Attending: Emergency Medicine | Admitting: Emergency Medicine

## 2018-04-22 ENCOUNTER — Encounter (HOSPITAL_COMMUNITY): Payer: Self-pay | Admitting: *Deleted

## 2018-04-22 ENCOUNTER — Emergency Department (HOSPITAL_COMMUNITY): Payer: BLUE CROSS/BLUE SHIELD

## 2018-04-22 DIAGNOSIS — B070 Plantar wart: Secondary | ICD-10-CM | POA: Diagnosis not present

## 2018-04-22 DIAGNOSIS — M79671 Pain in right foot: Secondary | ICD-10-CM | POA: Diagnosis not present

## 2018-04-22 DIAGNOSIS — Z79899 Other long term (current) drug therapy: Secondary | ICD-10-CM | POA: Diagnosis not present

## 2018-04-22 DIAGNOSIS — R030 Elevated blood-pressure reading, without diagnosis of hypertension: Secondary | ICD-10-CM | POA: Diagnosis not present

## 2018-04-22 DIAGNOSIS — F17210 Nicotine dependence, cigarettes, uncomplicated: Secondary | ICD-10-CM | POA: Insufficient documentation

## 2018-04-22 MED ORDER — DICLOFENAC SODIUM 1 % TD GEL
2.0000 g | Freq: Four times a day (QID) | TRANSDERMAL | 0 refills | Status: DC
Start: 2018-04-22 — End: 2020-06-03

## 2018-04-22 NOTE — ED Triage Notes (Signed)
Pt reports pain to R foot for the past two days. Reports having a knot and increased pain between fourth and fifth toe and pain with ambulation to bottom of foot. Denies injury

## 2018-04-22 NOTE — Discharge Instructions (Signed)
Please see the information and instructions below regarding your visit.  Your diagnoses today include:  1. Right foot pain   2. Plantar wart   3. Elevated blood pressure reading in office without diagnosis of hypertension     Tests performed today include: See side panel of your discharge paperwork for testing performed today. Vital signs are listed at the bottom of these instructions.   Your x-ray is normal today.  Medications prescribed:    Take any prescribed medications only as prescribed, and any over the counter medications only as directed on the packaging.  Please apply the diclofenac gel to the areas of your foot that are hurting before and after work.  Please purchase the medication called Dr. Margart Sickles wart treatment or an equivalent generic medication.  The active ingredient is salicylic acid.  You will apply these to the wart using package instructions.  Home care instructions:  Please follow any educational materials contained in this packet.   Follow-up instructions: Please follow-up with your primary care provider as soon as possible for further evaluation of your symptoms if they are not completely improved.   Return instructions:  Please return to the Emergency Department if you experience worsening symptoms.  Please return to the emergency department if you develop any worsening pain, redness of the foot or increasing swelling. Please return if you have any other emergent concerns.  Additional Information:   Your vital signs today were: BP (!) 153/89 (BP Location: Left Arm)    Pulse 76    Temp 98.6 F (37 C) (Oral)    Resp 18    LMP 04/05/2018    SpO2 100%  If your blood pressure (BP) was elevated on multiple readings during this visit above 130 for the top number or above 80 for the bottom number, please have this repeated by your primary care provider within one month. --------------  Thank you for allowing [place] to participate in your care today.

## 2018-04-22 NOTE — ED Provider Notes (Signed)
[facility] EMERGENCY DEPARTMENT Provider Note   CSN: [id number] Arrival date & time: 04/22/18  2035     History   Chief Complaint Chief Complaint  Patient presents with  . Ankle Pain    HPI [name] is a 38 y.o. female.  HPI   Patient is a 38 year old female with no significant past medical history presenting for right foot pain.  She reports that over the past 3 days she is noted some increasing pain in the plantar aspect of her right foot in addition to a painful "bump" in the interdigital region between the fourth and fifth digits of her right foot.  Denies drainage.  Patient denies known injury.  Patient denies any erythema of her foot.  Patient does report some mild swelling in the medial instep of her foot.  Patient works long hours on her feet at [employer].  No swelling extending up the right.  No remedies tried for symptoms.  Past Medical History:  Diagnosis Date  . No pertinent past medical history     There are no active problems to display for this patient.   Past Surgical History:  Procedure Laterality Date  . CESAREAN SECTION  2004  . DILATION AND CURETTAGE OF UTERUS  2007  . TUBAL LIGATION  2008     OB History    Gravida  4   Para  3   Term  0   Preterm      AB  1   Living  3     SAB  1   TAB      Ectopic      Multiple      Live Births               Home Medications    Prior to Admission medications   Medication Sig Start Date End Date Taking? Authorizing Provider  diclofenac sodium (VOLTAREN) 1 % GEL Apply 2 g topically 4 (four) times daily. 04/22/18   Aviva Kluver B, PA-C  ibuprofen (ADVIL,MOTRIN) 800 MG tablet Take 1 tablet (800 mg total) by mouth every 8 (eight) hours as needed for mild pain. 07/01/17   Ward, Layla Maw, DO  methocarbamol (ROBAXIN) 500 MG tablet Take 1 tablet (500 mg total) by mouth every 8 (eight) hours as needed for muscle spasms. 07/01/17   Ward, Layla Maw, DO  metroNIDAZOLE  (FLAGYL) 500 MG tablet Take 1 tablet (500 mg total) by mouth 2 (two) times daily. 08/23/17   Janne Napoleon, NP  predniSONE (STERAPRED UNI-PAK 21 TAB) 10 MG (21) TBPK tablet Take as directed 07/01/17   Ward, Layla Maw, DO  traMADol (ULTRAM) 50 MG tablet Take 1 tablet (50 mg total) every 6 (six) hours as needed by mouth. 02/02/17   Hayden Rasmussen, NP    Family History No family history on file.  Social History Social History   Tobacco Use  . Smoking status: Current Some Day Smoker    Types: Cigars  . Smokeless tobacco: Never Used  Substance Use Topics  . Alcohol use: Yes  . Drug use: No     Allergies   Shellfish allergy and Iodine   Review of Systems Review of Systems  Musculoskeletal: Positive for arthralgias and myalgias. Negative for joint swelling.  Skin: Negative for color change.  Neurological: Negative for weakness and numbness.     Physical Exam Updated Vital Signs BP (!) 153/89 (BP Location: Left Arm)   Pulse 76   Temp 98.6 F (  37 C) (Oral)   Resp 18   LMP 04/05/2018   SpO2 100%   Physical Exam Vitals signs and nursing note reviewed.  Constitutional:      General: She is not in acute distress.    Appearance: She is well-developed. She is not diaphoretic.     Comments: Sitting comfortably in bed.  HENT:     Head: Normocephalic and atraumatic.  Eyes:     General:        Right eye: No discharge.        Left eye: No discharge.     Conjunctiva/sclera: Conjunctivae normal.     Comments: EOMs normal to gross examination.  Neck:     Musculoskeletal: Normal range of motion.  Cardiovascular:     Rate and Rhythm: Normal rate and regular rhythm.     Comments: Intact, 2+ right DP pulse.  Pulmonary:     Comments: Patient converses comfortably without audible wheeze or stridor. Abdominal:     General: There is no distension.  Musculoskeletal: Normal range of motion.     Comments: Right foot without erythema.  Patient has a well-circumscribed swelling of the bursa  of the right medial instep.  Patient has tenderness to palpation along the plantar aspect over the distal metatarsals.  Patient exhibits a verrucous lesion in the interdigital region between the 4th and 5th digits of the RLE.   Skin:    General: Skin is warm and dry.  Neurological:     Mental Status: She is alert.     Comments: Cranial nerves intact to gross observation. Patient moves extremities without difficulty.  Psychiatric:        Behavior: Behavior normal.        Thought Content: Thought content normal.        Judgment: Judgment normal.      ED Treatments / Results  Labs (all labs ordered are listed, but only abnormal results are displayed) Labs Reviewed - No data to display  EKG None  Radiology Dg Foot Complete Right  Result Date: 04/22/2018 CLINICAL DATA:  RIGHT foot pain for 1 day, no injury. EXAM: RIGHT FOOT COMPLETE - 3+ VIEW COMPARISON:  None. FINDINGS: There is no evidence of fracture or dislocation. There is no evidence of arthropathy or other focal bone abnormality. Soft tissues are unremarkable. IMPRESSION: Negative. Electronically Signed   By: Awilda Metro M.D.   On: 04/22/2018 22:03    Procedures Procedures (including critical care time)  Medications Ordered in ED Medications - No data to display   Initial Impression / Assessment and Plan / ED Course  I have reviewed the triage vital signs and the nursing notes.  Pertinent labs & imaging results that were available during my care of the patient were reviewed by me and considered in my medical decision making (see chart for details).     Patient is well-appearing and in no acute distress.  Patient is neurovascularly intact in the right lower extremity.  Differential diagnosis includes bursitis, overuse injury, arthritis, plantar warts, plantar fasciitis.  Patient does exhibit what appears to be a plantar wart in the interdigital region between the fourth and fifth digits of the right foot.  Recommended  over-the-counter topical salicylic acid treatments.  Patient's x-ray is without acute bony abnormality.  Will treat with diclofenac gel.  Recommended patient to rest and ice.  Recommended finding shoes with arch support given the amount of time she spends on her feet.  Return precautions for any erythema or edema  of the right foot.  Patient is in understanding and agrees with the plan of care.  Final Clinical Impressions(s) / ED Diagnoses   Final diagnoses:  Right foot pain  Plantar wart  Elevated blood pressure reading in office without diagnosis of hypertension    ED Discharge Orders         Ordered    diclofenac sodium (VOLTAREN) 1 % GEL  4 times daily     04/22/18 2246           Delia ChimesMurray, Kenyada Dosch B, PA-C 04/22/18 2353    Virgina Norfolkuratolo, Adam, DO 04/23/18 0045

## 2018-09-09 ENCOUNTER — Other Ambulatory Visit: Payer: Self-pay

## 2018-09-09 ENCOUNTER — Encounter (HOSPITAL_COMMUNITY): Payer: Self-pay | Admitting: Emergency Medicine

## 2018-09-09 ENCOUNTER — Emergency Department (HOSPITAL_COMMUNITY)
Admission: EM | Admit: 2018-09-09 | Discharge: 2018-09-09 | Disposition: A | Discharge status: Home / Self Care | Payer: BC Managed Care – PPO | Attending: Emergency Medicine | Admitting: Emergency Medicine

## 2018-09-09 DIAGNOSIS — K0889 Other specified disorders of teeth and supporting structures: Secondary | ICD-10-CM | POA: Diagnosis present

## 2018-09-09 DIAGNOSIS — H9209 Otalgia, unspecified ear: Secondary | ICD-10-CM | POA: Diagnosis not present

## 2018-09-09 DIAGNOSIS — R51 Headache: Secondary | ICD-10-CM | POA: Diagnosis not present

## 2018-09-09 DIAGNOSIS — Z720 Tobacco use: Secondary | ICD-10-CM | POA: Insufficient documentation

## 2018-09-09 DIAGNOSIS — K029 Dental caries, unspecified: Secondary | ICD-10-CM | POA: Diagnosis not present

## 2018-09-09 MED ORDER — BUPIVACAINE-EPINEPHRINE (PF) 0.5% -1:200000 IJ SOLN
1.8000 mL | Freq: Once | INTRAMUSCULAR | Status: AC
  Administered 2018-09-09: 1.8 mL
  Filled 2018-09-09: qty 1.8

## 2018-09-09 MED ORDER — LIDOCAINE VISCOUS HCL 2 % MT SOLN: 15.0000 mL | OROMUCOSAL | 0 refills | Status: DC | PRN

## 2018-09-09 MED ORDER — PENICILLIN V POTASSIUM 250 MG/5ML PO SOLR: 500.0000 mg | Freq: Four times a day (QID) | ORAL | 0 refills | Status: AC

## 2018-09-09 MED ORDER — CHLORHEXIDINE GLUCONATE 0.12 % MT SOLN: 15.0000 mL | Freq: Two times a day (BID) | OROMUCOSAL | 0 refills | Status: DC

## 2018-09-09 NOTE — Discharge Instructions (Addendum)
Please see the information and instructions below regarding your visit.  Your diagnoses today include:  1. Pain, dental    You have a dental infection. It is very important that you get evaluated by a dentist as soon as possible. Call tomorrow to schedule an appointment. Ibuprofen/Acetaminophen (tylenol) as needed for pain. Take your full course of antibiotics. Read the instructions below.  Tests performed today include: See side panel of your discharge paperwork for testing performed today. Vital signs are listed at the bottom of these instructions.   Medications prescribed:    Take any prescribed medications only as prescribed, and any over the counter medications only as directed on the packaging.  1. You are prescribed penicillin, an antibiotic. Please take all of your antibiotics until finished.   You may develop abdominal discomfort or nausea from the antibiotic. If this occurs, you may take it with food. Some patients also get diarrhea with antibiotics. You may help offset this with probiotics which you can buy or get in yogurt. Do not eat or take the probiotics until 2 hours after your antibiotic. Some women develop vaginal yeast infections after antibiotics. If you develop unusual vaginal discharge after being on this medication, please see your primary care provider.   Some people develop allergies to antibiotics. Symptoms of antibiotic allergy can be mild and include a flat rash and itching. They can also be more serious and include:  ?Hives - Hives are raised, red patches of skin that are usually very itchy.  ?Lip or tongue swelling  ?Trouble swallowing or breathing  ?Blistering of the skin or mouth.  If you have any of these serious symptoms, please seek emergency medical care immediately.  2. You are prescribed ibuprofen, a non-steroidal anti-inflammatory agent (NSAID) for pain. You may take 600 mg every 6 hours as needed for pain. If still requiring this medication around  the clock for acute pain after 10 days, please see your primary healthcare provider.  You may combine this medication with Tylenol, 650 mg every 6 hours, so you are receiving something for pain every 3 hours.  This is not a long-term medication unless under the care and direction of your primary provider. Taking this medication long-term and not under the supervision of a healthcare provider could increase the risk of stomach ulcers, kidney problems, and cardiovascular problems such as high blood pressure.   3. You are prescribed chlorhexidine solution swish and spit twice daily. Do not swallow. This is to clear bacteria from your mouth.   4. You are prescribed viscous lidocaine swish and spit every 6 hours as needed for tooth and gum discomfort. Do not swallow.   Home care instructions:  Please follow any educational materials contained in this packet.   Eat a soft or liquid diet and rinse your mouth out after meals with warm water. You should see a dentist or return here at once if you have increased swelling, increased pain or uncontrolled bleeding from the site of your injury.  Follow-up instructions: It is very important that you see a dentist as soon as possible. There is a list of dentists attached to this packet if you do not have care established with a dentist already. Please give a call to a dentist of your choice tomorrow.  Return instructions:  Please return to the Emergency Department if you experience worsening symptoms.  Please seek care if you note any of the following about your dental pain:  You have increased pain not controlled with medicines.  You have swelling around your tooth, in your face or neck.  You have bleeding which starts, continues, or gets worse.  You have a fever >101 If you are unable to open your mouth Please return if you have any other emergent concerns.  Additional Information:   Your vital signs today were: BP 131/86 (BP Location: Left Arm)     Pulse 78    Temp 98.3 F (36.8 C) (Oral)    Resp 14    SpO2 100%  If your blood pressure (BP) was elevated on multiple readings during this visit above 130 for the top number or above 80 for the bottom number, please have this repeated by your primary care provider within one month. --------------  Thank you for allowing [place] to participate in your care today.

## 2018-09-09 NOTE — ED Provider Notes (Signed)
[facility] Provider Note   CSN: [id number] Arrival date & time: 09/09/18  [id number]     History   Chief Complaint Chief Complaint  Patient presents with  . Dental Pain    HPI [name] is a 38 y.o. female.     HPI  Patient is a 38-year female with no significant past medical history presenting for dental pain.  She reports that she has had dental pain for the past couple weeks however it worsened today and is causing her headaches and ear pain.  She denies any facial swelling, difficulty breathing, difficulty swelling, neck induration or submandibular tenderness.  She is tried over-the-counter ibuprofen for her symptoms without full relief.  She does not have a Education officer, communitydentist.  Past Medical History:  Diagnosis Date  . No pertinent past medical history     There are no active problems to display for this patient.   Past Surgical History:  Procedure Laterality Date  . CESAREAN SECTION  2004  . DILATION AND CURETTAGE OF UTERUS  2007  . TUBAL LIGATION  2008     OB History    Gravida  4   Para  3   Term  0   Preterm      AB  1   Living  3     SAB  1   TAB      Ectopic      Multiple      Live Births               Home Medications    Prior to Admission medications   Medication Sig Start Date End Date Taking? Authorizing Provider  chlorhexidine (PERIDEX) 0.12 % solution Use as directed 15 mLs in the mouth or throat 2 (two) times daily. Swish and spit.  Do not swallow. 09/09/18   Aviva KluverMurray, Francisco Ostrovsky B, PA-C  diclofenac sodium (VOLTAREN) 1 % GEL Apply 2 g topically 4 (four) times daily. 04/22/18   Aviva KluverMurray, Usman Millett B, PA-C  ibuprofen (ADVIL,MOTRIN) 800 MG tablet Take 1 tablet (800 mg total) by mouth every 8 (eight) hours as needed for mild pain. 07/01/17   Ward, Layla MawKristen N, DO  lidocaine (XYLOCAINE) 2 % solution Use as directed 15 mLs in the mouth or throat as needed for mouth pain. Swish and spit.  Do not swallow. 09/09/18    Aviva KluverMurray, Kattaleya Alia B, PA-C  methocarbamol (ROBAXIN) 500 MG tablet Take 1 tablet (500 mg total) by mouth every 8 (eight) hours as needed for muscle spasms. 07/01/17   Ward, Layla MawKristen N, DO  metroNIDAZOLE (FLAGYL) 500 MG tablet Take 1 tablet (500 mg total) by mouth 2 (two) times daily. 08/23/17   Janne NapoleonNeese, Hope M, NP  penicillin v potassium (VEETID) 250 MG/5ML solution Take 10 mLs (500 mg total) by mouth 4 (four) times daily for 5 days. 09/09/18 09/14/18  Aviva KluverMurray, Amerigo Mcglory B, PA-C  predniSONE (STERAPRED UNI-PAK 21 TAB) 10 MG (21) TBPK tablet Take as directed 07/01/17   Ward, Layla MawKristen N, DO  traMADol (ULTRAM) 50 MG tablet Take 1 tablet (50 mg total) every 6 (six) hours as needed by mouth. 02/02/17   Hayden RasmussenMabe, David, NP    Family History History reviewed. No pertinent family history.  Social History Social History   Tobacco Use  . Smoking status: Current Some Day Smoker    Types: Cigars  . Smokeless tobacco: Never Used  Substance Use Topics  . Alcohol use: Yes  . Drug use: No  Allergies   Shellfish allergy and Iodine   Review of Systems Review of Systems  Constitutional: Negative for chills and fever.  HENT: Positive for dental problem and ear pain. Negative for sore throat, trouble swallowing and voice change.   Gastrointestinal: Negative for nausea and vomiting.     Physical Exam Updated Vital Signs BP 131/86 (BP Location: Left Arm)   Pulse 78   Temp 98.3 F (36.8 C) (Oral)   Resp 14   SpO2 100%   Physical Exam Vitals signs and nursing note reviewed.  Constitutional:      General: She is not in acute distress.    Appearance: She is well-developed. She is not diaphoretic.     Comments: Sitting comfortably in bed.  HENT:     Head: Normocephalic and atraumatic.     Right Ear: Tympanic membrane normal.     Left Ear: Tympanic membrane normal.     Mouth/Throat:     Mouth: Mucous membranes are moist.      Comments: Dental cavities and poor oral dentition noted. Pain along tooth as  depicted in image. There is a large cavity present on this tooth. No abscess noted. Midline uvula. No trismus. OP clear and moist. No oropharyngeal erythema or edema. Neck supple with no tenderness. No facial edema.  Eyes:     General:        Right eye: No discharge.        Left eye: No discharge.     Conjunctiva/sclera: Conjunctivae normal.     Comments: EOMs normal to gross examination.  Neck:     Musculoskeletal: Normal range of motion.  Cardiovascular:     Rate and Rhythm: Normal rate and regular rhythm.     Comments: Intact, 2+ radial pulse. Pulmonary:     Comments: Converses comfortably without audible wheeze or stridor. Abdominal:     General: There is no distension.  Musculoskeletal: Normal range of motion.  Skin:    General: Skin is warm and dry.  Neurological:     Mental Status: She is alert.     Comments: Cranial nerves intact to gross observation. Patient moves extremities without difficulty.  Psychiatric:        Behavior: Behavior normal.        Thought Content: Thought content normal.        Judgment: Judgment normal.      ED Treatments / Results  Labs (all labs ordered are listed, but only abnormal results are displayed) Labs Reviewed - No data to display  EKG    Radiology No results found.  Procedures Dental Block  Date/Time: 09/09/2018 4:52 PM Performed by: Albesa Seen, PA-C Authorized by: Albesa Seen, PA-C   Consent:    Consent obtained:  Verbal   Consent given by:  Patient   Risks discussed:  Unsuccessful block and pain   Alternatives discussed:  No treatment Indications:    Indications: dental pain   Location:    Block type:  Supraperiosteal   Supraperiosteal location:  Upper teeth   Upper teeth location:  3/RU 1st molar Procedure details (see MAR for exact dosages):    Topical anesthetic:  Benzocaine gel   Needle gauge:  25 G   Anesthetic injected:  Bupivacaine 0.5% WITH epi   Injection procedure:  Anatomic landmarks  identified, introduced needle, incremental injection, negative aspiration for blood and anatomic landmarks palpated Post-procedure details:    Outcome:  Anesthesia achieved   (including critical care time)  Medications Ordered in  ED Medications  bupivacaine-epinephrine (MARCAINE W/ EPI) 0.5% -1:200000 injection 1.8 mL (1.8 mLs Infiltration Given by Other 09/09/18 1103)     Initial Impression / Assessment and Plan / ED Course  I have reviewed the triage vital signs and the nursing notes.  Pertinent labs & imaging results that were available during my care of the patient were reviewed by me and considered in my medical decision making (see chart for details).        [name] is a 38 y.o. female who presents to ED for dental pain. No abscess requiring immediate incision and drainage. Patient is afebrile, non toxic appearing, and swallowing secretions well. Exam not concerning for Ludwig's angina or pharyngeal abscess. Will treat with Penicillin, viscous lidocaine, and chlorhexidine. I provided dental resource guide and stressed the importance of dental follow up for ultimate management of dental pain. Patient voices understanding and is agreeable to plan.  Final Clinical Impressions(s) / ED Diagnoses   Final diagnoses:  Pain, dental    ED Discharge Orders         Ordered    penicillin v potassium (VEETID) 250 MG/5ML solution  4 times daily     09/09/18 1037    lidocaine (XYLOCAINE) 2 % solution  As needed     09/09/18 1037    chlorhexidine (PERIDEX) 0.12 % solution  2 times daily     09/09/18 1037           Elisha PonderMurray, Hillery Zachman B, PA-C 09/09/18 1653    Melene PlanFloyd, Dan, DO 09/11/18 [phone]

## 2018-09-09 NOTE — ED Triage Notes (Signed)
Pt arrives to ED from home with complaints of a tooth ache for the last two weeks. Pt reports that for the last three days it has caused her a headache.

## 2018-09-09 NOTE — ED Notes (Signed)
Patient verbalizes understanding of discharge instructions. Opportunity for questioning and answers were provided. Armband removed by staff, pt discharged from ED.  

## 2019-03-31 ENCOUNTER — Ambulatory Visit (HOSPITAL_COMMUNITY)
Admission: EM | Admit: 2019-03-31 | Discharge: 2019-03-31 | Disposition: A | Discharge status: Home / Self Care | Payer: Self-pay | Attending: Internal Medicine | Admitting: Internal Medicine

## 2019-03-31 ENCOUNTER — Other Ambulatory Visit: Payer: Self-pay

## 2019-03-31 ENCOUNTER — Telehealth: Payer: BC Managed Care – PPO

## 2019-03-31 ENCOUNTER — Encounter (HOSPITAL_COMMUNITY): Payer: Self-pay

## 2019-03-31 DIAGNOSIS — T192XXA Foreign body in vulva and vagina, initial encounter: Secondary | ICD-10-CM | POA: Insufficient documentation

## 2019-03-31 DIAGNOSIS — Z3202 Encounter for pregnancy test, result negative: Secondary | ICD-10-CM

## 2019-03-31 LAB — POCT URINALYSIS DIP (DEVICE)
Bilirubin Urine: NEGATIVE
Glucose, UA: NEGATIVE mg/dL
Hgb urine dipstick: NEGATIVE
Ketones, ur: NEGATIVE mg/dL
Leukocytes,Ua: NEGATIVE
Nitrite: NEGATIVE
Protein, ur: NEGATIVE mg/dL
Specific Gravity, Urine: 1.025 (ref 1.005–1.030)
Urobilinogen, UA: 0.2 mg/dL (ref 0.0–1.0)
pH: 7 (ref 5.0–8.0)

## 2019-03-31 LAB — POC URINE PREG, ED: Preg Test, Ur: NEGATIVE

## 2019-03-31 LAB — POCT PREGNANCY, URINE: Preg Test, Ur: NEGATIVE

## 2019-03-31 NOTE — Discharge Instructions (Signed)
Small amount of tissue removed, no further foreign body noted  Your urine was normal Swab pending to check for any vaginal infections  Please drink plenty of fluids  Follow-up if developing any pain, discharge, irritation or any concerns

## 2019-03-31 NOTE — ED Triage Notes (Signed)
Pt states she forget she had a tampon on Sunday & is stuck/lost in her vagina.

## 2019-04-01 NOTE — ED Provider Notes (Signed)
[facility]    CSN: [id number] Arrival date & time: 03/31/19  1509      History   Chief Complaint Chief Complaint  Patient presents with  . Foreign Body in Vagina    HPI [name] is a 39 y.o. female history of previous tubal ligation, presenting today for evaluation of possible retained tampon as well as urinary odor.  Patient states that on Sunday she believes when she took out her tampon that it was partial and believes part of the tampon may still be stuck in there.  She feels at times she has noticed some tissue with wiping.  Recently she has had some odor especially with urination.  Denies any abnormal discharge or bleeding.  Denies any abdominal pain.  HPI  Past Medical History:  Diagnosis Date  . No pertinent past medical history     There are no problems to display for this patient.   Past Surgical History:  Procedure Laterality Date  . CESAREAN SECTION  2004  . DILATION AND CURETTAGE OF UTERUS  2007  . TUBAL LIGATION  2008    OB History    Gravida  4   Para  3   Term  0   Preterm      AB  1   Living  3     SAB  1   TAB      Ectopic      Multiple      Live Births               Home Medications    Prior to Admission medications   Medication Sig Start Date End Date Taking? Authorizing Provider  chlorhexidine (PERIDEX) 0.12 % solution Use as directed 15 mLs in the mouth or throat 2 (two) times daily. Swish and spit.  Do not swallow. 09/09/18   Aviva Kluver B, PA-C  diclofenac sodium (VOLTAREN) 1 % GEL Apply 2 g topically 4 (four) times daily. 04/22/18   Aviva Kluver B, PA-C  ibuprofen (ADVIL,MOTRIN) 800 MG tablet Take 1 tablet (800 mg total) by mouth every 8 (eight) hours as needed for mild pain. 07/01/17   Ward, Layla Maw, DO  lidocaine (XYLOCAINE) 2 % solution Use as directed 15 mLs in the mouth or throat as needed for mouth pain. Swish and spit.  Do not swallow. 09/09/18   Aviva Kluver B, PA-C  methocarbamol  (ROBAXIN) 500 MG tablet Take 1 tablet (500 mg total) by mouth every 8 (eight) hours as needed for muscle spasms. 07/01/17   Ward, Layla Maw, DO  metroNIDAZOLE (FLAGYL) 500 MG tablet Take 1 tablet (500 mg total) by mouth 2 (two) times daily. 08/23/17   Janne Napoleon, NP  predniSONE (STERAPRED UNI-PAK 21 TAB) 10 MG (21) TBPK tablet Take as directed 07/01/17   Ward, Layla Maw, DO  traMADol (ULTRAM) 50 MG tablet Take 1 tablet (50 mg total) every 6 (six) hours as needed by mouth. 02/02/17   Hayden Rasmussen, NP    Family History Family History  Problem Relation Age of Onset  . Hypertension Mother   . Stroke Mother   . Hyperlipidemia Mother   . Seizures Mother   . Kidney failure Father     Social History Social History   Tobacco Use  . Smoking status: Current Some Day Smoker    Types: Cigars  . Smokeless tobacco: Never Used  Substance Use Topics  . Alcohol use: Yes  . Drug use: No  Allergies   Shellfish allergy and Iodine   Review of Systems Review of Systems  Constitutional: Negative for fever.  Respiratory: Negative for shortness of breath.   Cardiovascular: Negative for chest pain.  Gastrointestinal: Negative for abdominal pain, diarrhea, nausea and vomiting.  Genitourinary: Negative for dysuria, flank pain, genital sores, hematuria, menstrual problem, vaginal bleeding, vaginal discharge and vaginal pain.  Musculoskeletal: Negative for back pain.  Skin: Negative for rash.  Neurological: Negative for dizziness, light-headedness and headaches.     Physical Exam Triage Vital Signs ED Triage Vitals  Enc Vitals Group     BP 03/31/19 1541 128/84     Pulse Rate 03/31/19 1541 80     Resp 03/31/19 1541 18     Temp 03/31/19 1541 98.6 F (37 C)     Temp Source 03/31/19 1541 Oral     SpO2 03/31/19 1541 100 %     Weight 03/31/19 1537 151 lb (68.5 kg)     Height --      Head Circumference --      Peak Flow --      Pain Score 03/31/19 1537 0     Pain Loc --      Pain Edu? --        Excl. in [place]? --    No data found.  Updated Vital Signs BP 128/84 (BP Location: Right Arm)   Pulse 80   Temp 98.6 F (37 C) (Oral)   Resp 18   Wt 151 lb (68.5 kg)   LMP 03/22/2019   SpO2 100%   BMI 30.50 kg/m   Visual Acuity Right Eye Distance:   Left Eye Distance:   Bilateral Distance:    Right Eye Near:   Left Eye Near:    Bilateral Near:     Physical Exam Vitals and nursing note reviewed.  Constitutional:      Appearance: She is well-developed.     Comments: No acute distress  HENT:     Head: Normocephalic and atraumatic.     Nose: Nose normal.  Eyes:     Conjunctiva/sclera: Conjunctivae normal.  Cardiovascular:     Rate and Rhythm: Normal rate.  Pulmonary:     Effort: Pulmonary effort is normal. No respiratory distress.  Abdominal:     General: There is no distension.     Comments: Soft, nondistended, nontender to light and deep palpation  Genitourinary:    Comments: Normal external female genitalia, vaginal mucosa pink, small amount of tissue underneath cervix, after retrieval no further foreign bodies visualized within vagina, no significant discharge noted, no cervical erythema Musculoskeletal:        General: Normal range of motion.     Cervical back: Neck supple.  Skin:    General: Skin is warm and dry.  Neurological:     Mental Status: She is alert and oriented to person, place, and time.      UC Treatments / Results  Labs (all labs ordered are listed, but only abnormal results are displayed) Labs Reviewed  POC URINE PREG, ED  POCT URINALYSIS DIP (DEVICE)  POCT PREGNANCY, URINE  CERVICOVAGINAL ANCILLARY ONLY    EKG   Radiology No results found.  Procedures Procedures (including critical care time)  Medications Ordered in UC Medications - No data to display  Initial Impression / Assessment and Plan / UC Course  I have reviewed the triage vital signs and the nursing notes.  Pertinent labs & imaging results that were available  during my care  of the patient were reviewed by me and considered in my medical decision making (see chart for details).     UA unremarkable, vaginal swab pending to evaluate for many BV/yeast that may have been triggered by retained tampon.  Tissue removed, no further foreign bodies visualized.  Will call with results of swab and provide treatment if needed.  Continue to monitor,Discussed strict return precautions. Patient verbalized understanding and is agreeable with plan.  Final Clinical Impressions(s) / UC Diagnoses   Final diagnoses:  Retained tampon, initial encounter     Discharge Instructions     Small amount of tissue removed, no further foreign body noted  Your urine was normal Swab pending to check for any vaginal infections  Please drink plenty of fluids  Follow-up if developing any pain, discharge, irritation or any concerns   ED Prescriptions    None     PDMP not reviewed this encounter.   Lew Dawes, PA-C 04/01/19 1037

## 2019-04-05 ENCOUNTER — Telehealth (HOSPITAL_COMMUNITY): Payer: Self-pay | Admitting: Emergency Medicine

## 2019-04-05 LAB — CERVICOVAGINAL ANCILLARY ONLY
Bacterial vaginitis: POSITIVE — AB
Candida vaginitis: POSITIVE — AB
Chlamydia: NEGATIVE
Neisseria Gonorrhea: NEGATIVE
Trichomonas: NEGATIVE

## 2019-04-05 MED ORDER — FLUCONAZOLE 150 MG PO TABS: 150.0000 mg | ORAL_TABLET | Freq: Once | ORAL | 0 refills | Status: AC

## 2019-04-05 MED ORDER — METRONIDAZOLE 500 MG PO TABS: 500.0000 mg | ORAL_TABLET | Freq: Two times a day (BID) | ORAL | 0 refills | Status: AC

## 2019-04-05 NOTE — Telephone Encounter (Signed)
Bacterial vaginosis is positive. Pt needs treatment. Flagyl 500 mg BID x 7 days #14 no refills sent to patients pharmacy of choice.   Test for candida (yeast) was positive.  Prescription for fluconazole 150mg po now, repeat dose in 3d if needed, #2 no refills, sent to the pharmacy of record.  Recheck or followup with PCP for further evaluation if symptoms are not improving.    Patient contacted by phone and made aware of    results. Pt verbalized understanding and had all questions answered.  

## 2019-04-09 ENCOUNTER — Telehealth (HOSPITAL_COMMUNITY): Payer: Self-pay

## 2019-04-09 ENCOUNTER — Telehealth (HOSPITAL_COMMUNITY): Payer: Self-pay | Admitting: Emergency Medicine

## 2019-04-09 MED ORDER — METRONIDAZOLE 0.75 % VA GEL: 1.0000 | Freq: Every day | VAGINAL | 0 refills | Status: AC

## 2019-04-09 NOTE — Telephone Encounter (Signed)
Not tolerating oral flagyl, switching to metrogel

## 2019-09-15 ENCOUNTER — Other Ambulatory Visit: Payer: Self-pay

## 2019-09-15 ENCOUNTER — Encounter (HOSPITAL_COMMUNITY): Payer: Self-pay | Admitting: Emergency Medicine

## 2019-09-15 ENCOUNTER — Emergency Department (HOSPITAL_COMMUNITY): Payer: Medicaid Other

## 2019-09-15 ENCOUNTER — Emergency Department (HOSPITAL_COMMUNITY)
Admission: EM | Admit: 2019-09-15 | Discharge: 2019-09-16 | Disposition: A | Discharge status: Home / Self Care | Payer: Medicaid Other | Attending: Emergency Medicine | Admitting: Emergency Medicine

## 2019-09-15 DIAGNOSIS — R0789 Other chest pain: Secondary | ICD-10-CM | POA: Insufficient documentation

## 2019-09-15 DIAGNOSIS — Z5321 Procedure and treatment not carried out due to patient leaving prior to being seen by health care provider: Secondary | ICD-10-CM | POA: Diagnosis not present

## 2019-09-15 DIAGNOSIS — R0602 Shortness of breath: Secondary | ICD-10-CM | POA: Insufficient documentation

## 2019-09-15 LAB — BASIC METABOLIC PANEL
Anion gap: 9 (ref 5–15)
BUN: 12 mg/dL (ref 6–20)
CO2: 21 mmol/L — ABNORMAL LOW (ref 22–32)
Calcium: 9.1 mg/dL (ref 8.9–10.3)
Chloride: 111 mmol/L (ref 98–111)
Creatinine, Ser: 0.72 mg/dL (ref 0.44–1.00)
GFR calc Af Amer: >60 mL/min (ref >60)
GFR calc non Af Amer: >60 mL/min (ref >60)
Glucose, Bld: 97 mg/dL (ref 70–99)
Potassium: 3.5 mmol/L (ref 3.5–5.1)
Sodium: 141 mmol/L (ref 135–145)

## 2019-09-15 LAB — CBC
HCT: 34.5 % — ABNORMAL LOW (ref 36.0–46.0)
Hemoglobin: 11 g/dL — ABNORMAL LOW (ref 12.0–15.0)
MCH: 30.9 pg (ref 26.0–34.0)
MCHC: 31.9 g/dL (ref 30.0–36.0)
MCV: 96.9 fL (ref 80.0–100.0)
Platelets: 160 10*3/uL (ref 150–400)
RBC: 3.56 MIL/uL — ABNORMAL LOW (ref 3.87–5.11)
RDW: 13.2 % (ref 11.5–15.5)
WBC: 7.6 10*3/uL (ref 4.0–10.5)
nRBC: 0 % (ref 0.0–0.2)

## 2019-09-15 LAB — I-STAT BETA HCG BLOOD, ED (MC, WL, AP ONLY): I-stat hCG, quantitative: <5 m[IU]/mL (ref <5)

## 2019-09-15 LAB — TROPONIN I (HIGH SENSITIVITY): Troponin I (High Sensitivity): <2 ng/L (ref <18)

## 2019-09-15 MED ORDER — SODIUM CHLORIDE 0.9% FLUSH: 3.0000 mL | Freq: Once | INTRAVENOUS | Status: DC

## 2019-09-15 NOTE — ED Triage Notes (Signed)
Pt c/o generalized CP and shob after a coughing spell today at work.

## 2019-09-16 ENCOUNTER — Emergency Department (HOSPITAL_COMMUNITY)
Admission: EM | Admit: 2019-09-16 | Discharge: 2019-09-16 | Disposition: A | Discharge status: Home / Self Care | Payer: Medicaid Other | Attending: Emergency Medicine | Admitting: Emergency Medicine

## 2019-09-16 ENCOUNTER — Emergency Department (HOSPITAL_COMMUNITY): Payer: Medicaid Other

## 2019-09-16 ENCOUNTER — Other Ambulatory Visit: Payer: Self-pay

## 2019-09-16 DIAGNOSIS — Z87891 Personal history of nicotine dependence: Secondary | ICD-10-CM | POA: Diagnosis not present

## 2019-09-16 DIAGNOSIS — R0602 Shortness of breath: Secondary | ICD-10-CM | POA: Insufficient documentation

## 2019-09-16 DIAGNOSIS — R079 Chest pain, unspecified: Secondary | ICD-10-CM

## 2019-09-16 DIAGNOSIS — R0789 Other chest pain: Secondary | ICD-10-CM | POA: Diagnosis not present

## 2019-09-16 LAB — BASIC METABOLIC PANEL
Anion gap: 7 (ref 5–15)
BUN: 12 mg/dL (ref 6–20)
CO2: 23 mmol/L (ref 22–32)
Calcium: 8.8 mg/dL — ABNORMAL LOW (ref 8.9–10.3)
Chloride: 109 mmol/L (ref 98–111)
Creatinine, Ser: 0.73 mg/dL (ref 0.44–1.00)
GFR calc Af Amer: >60 mL/min (ref >60)
GFR calc non Af Amer: >60 mL/min (ref >60)
Glucose, Bld: 136 mg/dL — ABNORMAL HIGH (ref 70–99)
Potassium: 3.6 mmol/L (ref 3.5–5.1)
Sodium: 139 mmol/L (ref 135–145)

## 2019-09-16 LAB — CBC
HCT: 35.8 % — ABNORMAL LOW (ref 36.0–46.0)
Hemoglobin: 11.8 g/dL — ABNORMAL LOW (ref 12.0–15.0)
MCH: 31.8 pg (ref 26.0–34.0)
MCHC: 33 g/dL (ref 30.0–36.0)
MCV: 96.5 fL (ref 80.0–100.0)
Platelets: 166 10*3/uL (ref 150–400)
RBC: 3.71 MIL/uL — ABNORMAL LOW (ref 3.87–5.11)
RDW: 13.2 % (ref 11.5–15.5)
WBC: 6.6 10*3/uL (ref 4.0–10.5)
nRBC: 0 % (ref 0.0–0.2)

## 2019-09-16 LAB — TROPONIN I (HIGH SENSITIVITY)
Troponin I (High Sensitivity): 3 ng/L (ref <18)
Troponin I (High Sensitivity): <2 ng/L (ref <18)

## 2019-09-16 LAB — I-STAT BETA HCG BLOOD, ED (NOT ORDERABLE): I-stat hCG, quantitative: <5 m[IU]/mL (ref <5)

## 2019-09-16 LAB — D-DIMER, QUANTITATIVE: D-Dimer, Quant: 0.29 ug/mL-FEU (ref 0.00–0.50)

## 2019-09-16 MED ORDER — ALUM & MAG HYDROXIDE-SIMETH 200-200-20 MG/5ML PO SUSP
30.0000 mL | Freq: Once | ORAL | Status: AC
  Administered 2019-09-16: 30 mL via ORAL
  Filled 2019-09-16: qty 30

## 2019-09-16 MED ORDER — LIDOCAINE VISCOUS HCL 2 % MT SOLN
15.0000 mL | Freq: Once | OROMUCOSAL | Status: AC
  Administered 2019-09-16: 15 mL via ORAL
  Filled 2019-09-16: qty 15

## 2019-09-16 NOTE — ED Triage Notes (Signed)
Per patient, chest pain starting yesterday while at work. Patient points to sternum when asked where pain is. Denies any radiation of pain. Patient states taking deep breaths alleviates pain. Reports non productive cough as well. Pain rated 7/10. Denies injury.

## 2019-09-16 NOTE — Discharge Instructions (Signed)
Your work-up was overall reassuring.  You may take Tylenol or ibuprofen for your chest pain.  Please make sure to follow-up with [facility] whose information I provided for further evaluation of your symptoms if they symptoms do not improve.  Return to the ER if your symptoms worsen.

## 2019-09-16 NOTE — ED Provider Notes (Signed)
[facility]ENC[facility]PT Provider Note   CSN: [id number] Arrival date & time: 09/16/19  1232     History Chief Complaint  Patient presents with  . Chest Pain    [name] is a 39 y.o. female.  HPI 39 year old female with no significant medical history presents to the ER with centralized chest pain which started yesterday at work.  Patient states that she was lifting heavy boxes, when she felt centralized sharp chest pain.  This remained constant for several hours, and stated that she had to stop working due to this.  She states that it did not radiate anywhere.  She also states that she has some associated shortness of breath, and pain with movement with his pain.  She has not tried anything for her symptoms.  She states that she woke up and the pain is still largely there.  She also endorses a dry cough.  Denies any fevers or chills.  No recent surgeries, travel, or oral estrogen use.  She states that she has had chest pain like this before but it has never been this bad.  She denies any headaches, back pain, dizziness.  Rates the pain as 7/10.  No known injury.  No nausea or vomiting.  She is a smoker.  No history of heart problems.    Past Medical History:  Diagnosis Date  . No pertinent past medical history     There are no problems to display for this patient.   Past Surgical History:  Procedure Laterality Date  . CESAREAN SECTION  2004  . DILATION AND CURETTAGE OF UTERUS  2007  . TUBAL LIGATION  2008     OB History    Gravida  4   Para  3   Term  0   Preterm      AB  1   Living  3     SAB  1   TAB      Ectopic      Multiple      Live Births              Family History  Problem Relation Age of Onset  . Hypertension Mother   . Stroke Mother   . Hyperlipidemia Mother   . Seizures Mother   . Kidney failure Father     Social History   Tobacco Use  . Smoking status: Former Smoker    Types: Cigars  . Smokeless  tobacco: Never Used  Substance Use Topics  . Alcohol use: Yes  . Drug use: No    Home Medications Prior to Admission medications   Medication Sig Start Date End Date Taking? Authorizing Provider  chlorhexidine (PERIDEX) 0.12 % solution Use as directed 15 mLs in the mouth or throat 2 (two) times daily. Swish and spit.  Do not swallow. Patient not taking: Reported on 09/16/2019 09/09/18   Aviva Kluver B, PA-C  diclofenac sodium (VOLTAREN) 1 % GEL Apply 2 g topically 4 (four) times daily. Patient not taking: Reported on 09/16/2019 04/22/18   Aviva Kluver B, PA-C  ibuprofen (ADVIL,MOTRIN) 800 MG tablet Take 1 tablet (800 mg total) by mouth every 8 (eight) hours as needed for mild pain. Patient not taking: Reported on 09/16/2019 07/01/17   Ward, Layla Maw, DO  lidocaine (XYLOCAINE) 2 % solution Use as directed 15 mLs in the mouth or throat as needed for mouth pain. Swish and spit.  Do not swallow. Patient not taking: Reported on 09/16/2019 09/09/18  Dayton Scrape, Alyssa B, PA-C  methocarbamol (ROBAXIN) 500 MG tablet Take 1 tablet (500 mg total) by mouth every 8 (eight) hours as needed for muscle spasms. Patient not taking: Reported on 09/16/2019 07/01/17   Ward, Layla Maw, DO  predniSONE (STERAPRED UNI-PAK 21 TAB) 10 MG (21) TBPK tablet Take as directed Patient not taking: Reported on 09/16/2019 07/01/17   Ward, Layla Maw, DO  traMADol (ULTRAM) 50 MG tablet Take 1 tablet (50 mg total) every 6 (six) hours as needed by mouth. Patient not taking: Reported on 09/16/2019 02/02/17   Hayden Rasmussen, NP    Allergies    Shellfish allergy and Iodine  Review of Systems   Review of Systems  Constitutional: Negative for chills and fever.  HENT: Negative for ear pain and sore throat.   Eyes: Negative for pain and visual disturbance.  Respiratory: Positive for shortness of breath. Negative for cough.   Cardiovascular: Positive for chest pain. Negative for palpitations.  Gastrointestinal: Negative for abdominal pain, nausea  and vomiting.  Genitourinary: Negative for dysuria and hematuria.  Musculoskeletal: Negative for arthralgias and back pain.  Skin: Negative for color change and rash.  Neurological: Negative for dizziness, seizures and syncope.  All other systems reviewed and are negative.   Physical Exam Updated Vital Signs BP 133/90 (BP Location: Left Arm)   Pulse 76   Temp 98.4 F (36.9 C) (Oral)   Resp 18   Ht 4\' 11"  (1.499 m)   Wt 72.6 kg   LMP 08/16/2019 (Exact Date)   SpO2 100%   BMI 32.32 kg/m   Physical Exam Vitals and nursing note reviewed.  Constitutional:      General: She is not in acute distress.    Appearance: She is well-developed. She is not ill-appearing or diaphoretic.  HENT:     Head: Normocephalic and atraumatic.  Eyes:     Conjunctiva/sclera: Conjunctivae normal.  Cardiovascular:     Rate and Rhythm: Normal rate and regular rhythm.     Pulses:          Carotid pulses are 2+ on the right side and 2+ on the left side.      Radial pulses are 2+ on the right side and 2+ on the left side.     Heart sounds: Normal heart sounds. No murmur heard.   Pulmonary:     Effort: Pulmonary effort is normal. No respiratory distress.     Breath sounds: Normal breath sounds.  Chest:     Chest wall: No tenderness or edema.  Abdominal:     Palpations: Abdomen is soft.     Tenderness: There is no abdominal tenderness.  Musculoskeletal:     Cervical back: Neck supple.  Skin:    General: Skin is warm and dry.  Neurological:     General: No focal deficit present.     Mental Status: She is alert.  Psychiatric:        Mood and Affect: Mood normal.     ED Results / Procedures / Treatments   Labs (all labs ordered are listed, but only abnormal results are displayed) Labs Reviewed  BASIC METABOLIC PANEL - Abnormal; Notable for the following components:      Result Value   Glucose, Bld 136 (*)    Calcium 8.8 (*)    All other components within normal limits  CBC - Abnormal;  Notable for the following components:   RBC 3.71 (*)    Hemoglobin 11.8 (*)    HCT 35.8 (*)  All other components within normal limits  D-DIMER, QUANTITATIVE (NOT AT [facility])  I-STAT BETA HCG BLOOD, ED ([facility], [facility], [facility] ONLY)  I-STAT BETA HCG BLOOD, ED (NOT ORDERABLE)  TROPONIN I (HIGH SENSITIVITY)    EKG None  Radiology DG Chest 2 View  Result Date: 09/16/2019 CLINICAL DATA:  Cough, chest pain EXAM: CHEST - 2 VIEW COMPARISON:  09/15/2019 FINDINGS: The heart size and mediastinal contours are within normal limits. Both lungs are clear. The visualized skeletal structures are unremarkable. IMPRESSION: No active cardiopulmonary disease. Electronically Signed   By: Helyn Numbers MD   On: 09/16/2019 13:27   DG Chest 2 View  Result Date: 09/15/2019 CLINICAL DATA:  Generalized chest pain and shortness of breath after coughing spell today at work EXAM: CHEST - 2 VIEW COMPARISON:  07/18/2016 FINDINGS: The heart size and mediastinal contours are within normal limits. Both lungs are clear. The visualized skeletal structures are unremarkable. IMPRESSION: No active cardiopulmonary disease. Electronically Signed   By: Sharlet Salina M.D.   On: 09/15/2019 21:31    Procedures Procedures (including critical care time)  Medications Ordered in ED Medications  alum & mag hydroxide-simeth (MAALOX/MYLANTA) 200-200-20 MG/5ML suspension 30 mL (30 mLs Oral Given 09/16/19 1432)    And  lidocaine (XYLOCAINE) 2 % viscous mouth solution 15 mL (15 mLs Oral Given 09/16/19 1432)    ED Course  I have reviewed the triage vital signs and the nursing notes.  Pertinent labs & imaging results that were available during my care of the patient were reviewed by me and considered in my medical decision making (see chart for details).    MDM Rules/Calculators/A&P                         39 year old female with centralized chest pain which started yesterday and into today On presentation, the patient is alert and oriented,  nontoxic-appearing, in no acute distress, speaking full sentences without increased work of breathing.  Physical exam without any abnormalities, lung sounds clear, normal heart sounds.  No reproducible chest wall tenderness.  Patient reports worsening pain when is sitting up.  No evidence of rashes.  BMP without significant electrode abnormalities, normal creatinine.  CBC without leukocytosis, mildly decreased able metabolic 0.8.  Initial troponin<2, do not think an additional troponin is needed given that her chest pain onset has been more than 6 hours.  D-dimer negative.  Beta-hCG negative.  Chest x-ray without acute abnormalities.  EKG without abnormalities.  Patient was treated with GI cocktail in the ER, however with little relief.  Suspect that this chest pain may be musculoskeletal he driven.  Doubt ACS, PE, dissection.  No evidence of pneumonia or infectious symptoms.  She also may have some irritation from smoking.  Return precautions given.  Overall work-up reassuring.  Patient does not have a PCP, I will refer her to [facility] for further follow-up if her symptoms not improved. Final Clinical Impression(s) / ED Diagnoses Final diagnoses:  Chest pain, unspecified type    Rx / DC Orders ED Discharge Orders    None       Leone Brand 09/16/19 1455    Pollyann Savoy, MD 09/16/19 [phone]

## 2019-09-23 ENCOUNTER — Encounter (HOSPITAL_COMMUNITY): Payer: Self-pay

## 2019-09-23 ENCOUNTER — Ambulatory Visit (HOSPITAL_COMMUNITY)
Admission: EM | Admit: 2019-09-23 | Discharge: 2019-09-23 | Disposition: A | Discharge status: Home / Self Care | Payer: Medicaid Other | Attending: Family Medicine | Admitting: Family Medicine

## 2019-09-23 ENCOUNTER — Other Ambulatory Visit: Payer: Self-pay

## 2019-09-23 ENCOUNTER — Ambulatory Visit (INDEPENDENT_AMBULATORY_CARE_PROVIDER_SITE_OTHER): Payer: Medicaid Other

## 2019-09-23 DIAGNOSIS — Z79899 Other long term (current) drug therapy: Secondary | ICD-10-CM | POA: Insufficient documentation

## 2019-09-23 DIAGNOSIS — Z87891 Personal history of nicotine dependence: Secondary | ICD-10-CM | POA: Diagnosis not present

## 2019-09-23 DIAGNOSIS — Z7952 Long term (current) use of systemic steroids: Secondary | ICD-10-CM | POA: Insufficient documentation

## 2019-09-23 DIAGNOSIS — Z888 Allergy status to other drugs, medicaments and biological substances status: Secondary | ICD-10-CM | POA: Diagnosis not present

## 2019-09-23 DIAGNOSIS — U071 COVID-19: Secondary | ICD-10-CM | POA: Insufficient documentation

## 2019-09-23 DIAGNOSIS — Z791 Long term (current) use of non-steroidal anti-inflammatories (NSAID): Secondary | ICD-10-CM | POA: Diagnosis not present

## 2019-09-23 DIAGNOSIS — R079 Chest pain, unspecified: Secondary | ICD-10-CM

## 2019-09-23 DIAGNOSIS — R0602 Shortness of breath: Secondary | ICD-10-CM

## 2019-09-23 DIAGNOSIS — J069 Acute upper respiratory infection, unspecified: Secondary | ICD-10-CM

## 2019-09-23 MED ORDER — ALBUTEROL SULFATE HFA 108 (90 BASE) MCG/ACT IN AERS: 1.0000 | INHALATION_SPRAY | Freq: Four times a day (QID) | RESPIRATORY_TRACT | 0 refills | Status: DC | PRN

## 2019-09-23 MED ORDER — BENZONATATE 100 MG PO CAPS
100.0000 mg | ORAL_CAPSULE | Freq: Three times a day (TID) | ORAL | 0 refills | Status: DC
Start: 2019-09-23 — End: 2020-06-03

## 2019-09-23 NOTE — ED Triage Notes (Signed)
Pt presents with headache, chills, nasal congestion and body aches x 3 days. Theraflu gives somewhat relieve.

## 2019-09-23 NOTE — Discharge Instructions (Signed)
Your chest xray looks well again today which is reassuring.  This is likely viral in nature.  Self isolate until covid results are back and negative.  Will notify you by phone of any positive findings. Your negative results will be sent through your MyChart.     Push fluids to ensure adequate hydration and keep secretions thin.  Tylenol and/or ibuprofen as needed for pain or fevers.  Rest.  Inhaler as needed for chest tightness, wheezing or shortness of breath .  Tessalon as needed for cough.  If symptoms worsen or do not improve in the next week to return to be seen or to follow up with your PCP.

## 2019-09-23 NOTE — ED Provider Notes (Signed)
[facility]    CSN: [id number] Arrival date & time: 09/23/19  1229      History   Chief Complaint Chief Complaint  Patient presents with  . Headache  . Generalized Body Aches    HPI [name] is a 39 y.o. female.   [name] presents with complaints of cough, headache, chills, body aches, upper back soreness which started around 7/3. She went to the ER the day prior to her symptoms, to be evaluated for chest pain. She was discharged home after evaluation. Has had some loose stool. Works in [employer]. No known ill contacts. Her son has developed similar symptoms after her's started, however. Has taken theraflu which hasn't helped. No specific shortness of breath . No known fevers. No ear pain or sore throat. No nausea or vomiting. She smokes 2 black and mild's a day. No history of covid-19 and has not received vaccination.    ROS per HPI, negative if not otherwise mentioned.      Past Medical History:  Diagnosis Date  . No pertinent past medical history     There are no problems to display for this patient.   Past Surgical History:  Procedure Laterality Date  . CESAREAN SECTION  2004  . DILATION AND CURETTAGE OF UTERUS  2007  . TUBAL LIGATION  2008    OB History    Gravida  4   Para  3   Term  0   Preterm      AB  1   Living  3     SAB  1   TAB      Ectopic      Multiple      Live Births               Home Medications    Prior to Admission medications   Medication Sig Start Date End Date Taking? Authorizing Provider  pseudoephedrine-acetaminophen (TYLENOL SINUS) 30-500 MG TABS tablet Take 1 tablet by mouth every 4 (four) hours as needed.   Yes [provider]  albuterol (PROAIR HFA) 108 (90 Base) MCG/ACT inhaler Inhale 1-2 puffs into the lungs every 6 (six) hours as needed for wheezing or shortness of breath. 09/23/19   Georgetta Haber, NP  benzonatate (TESSALON) 100 MG capsule Take 1 capsule  (100 mg total) by mouth every 8 (eight) hours. 09/23/19   Georgetta Haber, NP  chlorhexidine (PERIDEX) 0.12 % solution Use as directed 15 mLs in the mouth or throat 2 (two) times daily. Swish and spit.  Do not swallow. Patient not taking: Reported on 09/16/2019 09/09/18   Aviva Kluver B, PA-C  diclofenac sodium (VOLTAREN) 1 % GEL Apply 2 g topically 4 (four) times daily. Patient not taking: Reported on 09/16/2019 04/22/18   Aviva Kluver B, PA-C  ibuprofen (ADVIL,MOTRIN) 800 MG tablet Take 1 tablet (800 mg total) by mouth every 8 (eight) hours as needed for mild pain. Patient not taking: Reported on 09/16/2019 07/01/17   Ward, Layla Maw, DO  lidocaine (XYLOCAINE) 2 % solution Use as directed 15 mLs in the mouth or throat as needed for mouth pain. Swish and spit.  Do not swallow. Patient not taking: Reported on 09/16/2019 09/09/18   Aviva Kluver B, PA-C  methocarbamol (ROBAXIN) 500 MG tablet Take 1 tablet (500 mg total) by mouth every 8 (eight) hours as needed for muscle spasms. Patient not taking: Reported on 09/16/2019 07/01/17   Ward, Layla Maw, DO  predniSONE [facility]  UNI-PAK 21 TAB) 10 MG (21) TBPK tablet Take as directed Patient not taking: Reported on 09/16/2019 07/01/17   Ward, Layla Maw, DO  traMADol (ULTRAM) 50 MG tablet Take 1 tablet (50 mg total) every 6 (six) hours as needed by mouth. Patient not taking: Reported on 09/16/2019 02/02/17   Hayden Rasmussen, NP    Family History Family History  Problem Relation Age of Onset  . Hypertension Mother   . Stroke Mother   . Hyperlipidemia Mother   . Seizures Mother   . Kidney failure Father     Social History Social History   Tobacco Use  . Smoking status: Former Smoker    Types: Cigars  . Smokeless tobacco: Never Used  Substance Use Topics  . Alcohol use: Yes  . Drug use: No     Allergies   Shellfish allergy and Iodine   Review of Systems Review of Systems   Physical Exam Triage Vital Signs ED Triage Vitals  Enc Vitals Group      BP 09/23/19 1326 134/89     Pulse Rate 09/23/19 1326 95     Resp 09/23/19 1326 18     Temp 09/23/19 1326 98.6 F (37 C)     Temp Source 09/23/19 1326 Oral     SpO2 09/23/19 1326 100 %     Weight --      Height --      Head Circumference --      Peak Flow --      Pain Score 09/23/19 1323 8     Pain Loc --      Pain Edu? --      Excl. in GC? --    No data found.  Updated Vital Signs BP 134/89 (BP Location: Left Arm)   Pulse 95   Temp 98.6 F (37 C) (Oral)   Resp 18   LMP 09/17/2019 Comment: 1 week  SpO2 100%    Physical Exam Constitutional:      General: She is not in acute distress.    Appearance: She is well-developed.  HENT:     Nose: Rhinorrhea present.     Mouth/Throat:     Mouth: Mucous membranes are moist.     Pharynx: Oropharynx is clear. No oropharyngeal exudate.  Cardiovascular:     Rate and Rhythm: Normal rate.  Pulmonary:     Effort: Pulmonary effort is normal.     Breath sounds: No decreased breath sounds, wheezing, rhonchi or rales.     Comments: Occasional cough noted  Skin:    General: Skin is warm and dry.  Neurological:     Mental Status: She is alert and oriented to person, place, and time.      UC Treatments / Results  Labs (all labs ordered are listed, but only abnormal results are displayed) Labs Reviewed  SARS CORONAVIRUS 2 (TAT 6-24 HRS)    EKG   Radiology DG Chest 2 View  Result Date: 09/23/2019 CLINICAL DATA:  Shortness of breath, chest pain. EXAM: CHEST - 2 VIEW COMPARISON:  September 16, 2019. FINDINGS: The heart size and mediastinal contours are within normal limits. Both lungs are clear. No pneumothorax or pleural effusion is noted. The visualized skeletal structures are unremarkable. IMPRESSION: No active cardiopulmonary disease. Electronically Signed   By: Lupita Raider M.D.   On: 09/23/2019 14:22    Procedures Procedures (including critical care time)  Medications Ordered in UC Medications - No data to display  Initial  Impression / Assessment  and Plan / UC Course  I have reviewed the triage vital signs and the nursing notes.  Pertinent labs & imaging results that were available during my care of the patient were reviewed by me and considered in my medical decision making (see chart for details).     Non toxic. Afebrile. No red flag findings. No work of breathing. cxr remains clear. History and physical consistent with viral illness.  covid testing collected and pending. Supportive cares recommended. Encouraged to quit smoking. Return precautions provided. Patient verbalized understanding and agreeable to plan.   Final Clinical Impressions(s) / UC Diagnoses   Final diagnoses:  Upper respiratory tract infection, unspecified type     Discharge Instructions     Your chest xray looks well again today which is reassuring.  This is likely viral in nature.  Self isolate until covid results are back and negative.  Will notify you by phone of any positive findings. Your negative results will be sent through your MyChart.     Push fluids to ensure adequate hydration and keep secretions thin.  Tylenol and/or ibuprofen as needed for pain or fevers.  Rest.  Inhaler as needed for chest tightness, wheezing or shortness of breath .  Tessalon as needed for cough.  If symptoms worsen or do not improve in the next week to return to be seen or to follow up with your PCP.      ED Prescriptions    Medication Sig Dispense Auth. Provider   albuterol (PROAIR HFA) 108 (90 Base) MCG/ACT inhaler Inhale 1-2 puffs into the lungs every 6 (six) hours as needed for wheezing or shortness of breath. 8 g Linus Mako B, NP   benzonatate (TESSALON) 100 MG capsule Take 1 capsule (100 mg total) by mouth every 8 (eight) hours. 21 capsule Georgetta Haber, NP     PDMP not reviewed this encounter.   Georgetta Haber, NP 09/23/19 1442

## 2019-09-24 LAB — SARS CORONAVIRUS 2 (TAT 6-24 HRS): SARS Coronavirus 2: POSITIVE — AB

## 2019-09-25 ENCOUNTER — Other Ambulatory Visit: Payer: Self-pay | Admitting: Adult Health

## 2019-09-25 DIAGNOSIS — U071 COVID-19: Secondary | ICD-10-CM

## 2019-09-25 NOTE — Progress Notes (Signed)
I connected by phone with [name] on 09/25/2019 at 11:01 AM to discuss the potential use of a new treatment for mild to moderate COVID-19 viral infection in non-hospitalized patients.  This patient is a 39 y.o. female that meets the FDA criteria for Emergency Use Authorization of COVID monoclonal antibody casirivimab/imdevimab.  Has a (+) direct SARS-CoV-2 viral test result  Has mild or moderate COVID-19   Is NOT hospitalized due to COVID-19  Is within 10 days of symptom onset  Has at least one of the high risk factor(s) for progression to severe COVID-19 and/or hospitalization as defined in EUA.  Specific high risk criteria : BMI > 25   I have spoken and communicated the following to the patient or parent/caregiver regarding COVID monoclonal antibody treatment:  1. FDA has authorized the emergency use for the treatment of mild to moderate COVID-19 in adults and pediatric patients with positive results of direct SARS-CoV-2 viral testing who are 38 years of age and older weighing at least 40 kg, and who are at high risk for progressing to severe COVID-19 and/or hospitalization.  2. The significant known and potential risks and benefits of COVID monoclonal antibody, and the extent to which such potential risks and benefits are unknown.  3. Information on available alternative treatments and the risks and benefits of those alternatives, including clinical trials.  4. Patients treated with COVID monoclonal antibody should continue to self-isolate and use infection control measures (e.g., wear mask, isolate, social distance, avoid sharing personal items, clean and disinfect "high touch" surfaces, and frequent handwashing) according to CDC guidelines.   5. The patient or parent/caregiver has the option to accept or refuse COVID monoclonal antibody treatment.  After reviewing this information with the patient, The patient agreed to proceed with receiving casirivimab\imdevimab  infusion and will be provided a copy of the Fact sheet prior to receiving the infusion. [name] 09/25/2019 11:01 AM

## 2019-09-26 MED ORDER — SODIUM CHLORIDE 0.9 % IV SOLN
Freq: Once | INTRAVENOUS | Status: DC
  Filled 2019-09-26: qty 5

## 2019-09-27 ENCOUNTER — Ambulatory Visit (HOSPITAL_COMMUNITY)
Admission: RE | Admit: 2019-09-27 | Discharge: 2019-09-27 | Disposition: A | Discharge status: Home / Self Care | Payer: Medicaid Other | Source: Ambulatory Visit | Attending: Pulmonary Disease | Admitting: Pulmonary Disease

## 2019-09-27 DIAGNOSIS — U071 COVID-19: Secondary | ICD-10-CM

## 2020-06-02 ENCOUNTER — Emergency Department (HOSPITAL_COMMUNITY)
Admission: EM | Admit: 2020-06-02 | Discharge: 2020-06-03 | Disposition: A | Discharge status: Home / Self Care | Payer: Medicaid Other | Attending: Emergency Medicine | Admitting: Emergency Medicine

## 2020-06-02 ENCOUNTER — Other Ambulatory Visit: Payer: Self-pay

## 2020-06-02 ENCOUNTER — Encounter (HOSPITAL_COMMUNITY): Payer: Self-pay

## 2020-06-02 DIAGNOSIS — M545 Low back pain, unspecified: Secondary | ICD-10-CM | POA: Insufficient documentation

## 2020-06-02 DIAGNOSIS — Z87891 Personal history of nicotine dependence: Secondary | ICD-10-CM | POA: Diagnosis not present

## 2020-06-02 DIAGNOSIS — B9689 Other specified bacterial agents as the cause of diseases classified elsewhere: Secondary | ICD-10-CM | POA: Diagnosis not present

## 2020-06-02 DIAGNOSIS — R1031 Right lower quadrant pain: Secondary | ICD-10-CM

## 2020-06-02 DIAGNOSIS — N760 Acute vaginitis: Secondary | ICD-10-CM | POA: Diagnosis not present

## 2020-06-02 DIAGNOSIS — R109 Unspecified abdominal pain: Secondary | ICD-10-CM | POA: Diagnosis present

## 2020-06-02 LAB — BASIC METABOLIC PANEL
Anion gap: 7 (ref 5–15)
BUN: 12 mg/dL (ref 6–20)
CO2: 20 mmol/L — ABNORMAL LOW (ref 22–32)
Calcium: 8.9 mg/dL (ref 8.9–10.3)
Chloride: 110 mmol/L (ref 98–111)
Creatinine, Ser: 0.6 mg/dL (ref 0.44–1.00)
GFR, Estimated: >60 mL/min (ref >60)
Glucose, Bld: 115 mg/dL — ABNORMAL HIGH (ref 70–99)
Potassium: 3.5 mmol/L (ref 3.5–5.1)
Sodium: 137 mmol/L (ref 135–145)

## 2020-06-02 LAB — I-STAT BETA HCG BLOOD, ED (MC, WL, AP ONLY): I-stat hCG, quantitative: <5 m[IU]/mL (ref <5)

## 2020-06-02 LAB — CBC
HCT: 36.1 % (ref 36.0–46.0)
Hemoglobin: 11.8 g/dL — ABNORMAL LOW (ref 12.0–15.0)
MCH: 31.6 pg (ref 26.0–34.0)
MCHC: 32.7 g/dL (ref 30.0–36.0)
MCV: 96.5 fL (ref 80.0–100.0)
Platelets: 182 10*3/uL (ref 150–400)
RBC: 3.74 MIL/uL — ABNORMAL LOW (ref 3.87–5.11)
RDW: 13 % (ref 11.5–15.5)
WBC: 8.2 10*3/uL (ref 4.0–10.5)
nRBC: 0 % (ref 0.0–0.2)

## 2020-06-02 NOTE — ED Triage Notes (Signed)
Patient reports R sided back pain radiating into her abdomen. Denies N/V/D.

## 2020-06-03 ENCOUNTER — Emergency Department (HOSPITAL_COMMUNITY): Payer: Medicaid Other

## 2020-06-03 LAB — URINALYSIS, ROUTINE W REFLEX MICROSCOPIC
Bilirubin Urine: NEGATIVE
Glucose, UA: NEGATIVE mg/dL
Hgb urine dipstick: NEGATIVE
Ketones, ur: NEGATIVE mg/dL
Leukocytes,Ua: NEGATIVE
Nitrite: NEGATIVE
Protein, ur: NEGATIVE mg/dL
Specific Gravity, Urine: 1.021 (ref 1.005–1.030)
pH: 8 (ref 5.0–8.0)

## 2020-06-03 LAB — WET PREP, GENITAL
Sperm: NONE SEEN
Trich, Wet Prep: NONE SEEN
Yeast Wet Prep HPF POC: NONE SEEN

## 2020-06-03 MED ORDER — KETOROLAC TROMETHAMINE 30 MG/ML IJ SOLN
15.0000 mg | Freq: Once | INTRAMUSCULAR | Status: AC
  Administered 2020-06-03: 15 mg via INTRAVENOUS
  Filled 2020-06-03: qty 1

## 2020-06-03 MED ORDER — METRONIDAZOLE 500 MG PO TABS: 500.0000 mg | ORAL_TABLET | Freq: Two times a day (BID) | ORAL | 0 refills | Status: DC

## 2020-06-03 MED ORDER — ONDANSETRON HCL 4 MG/2ML IJ SOLN
4.0000 mg | Freq: Once | INTRAMUSCULAR | Status: AC
  Administered 2020-06-03: 4 mg via INTRAVENOUS
  Filled 2020-06-03: qty 2

## 2020-06-03 MED ORDER — FENTANYL CITRATE (PF) 100 MCG/2ML IJ SOLN
50.0000 ug | Freq: Once | INTRAMUSCULAR | Status: DC
Start: 2020-06-03 — End: 2020-06-03
  Filled 2020-06-03: qty 2

## 2020-06-03 MED ORDER — ONDANSETRON 4 MG PO TBDP: 4.0000 mg | ORAL_TABLET | Freq: Three times a day (TID) | ORAL | 0 refills | Status: DC | PRN

## 2020-06-03 MED ORDER — SODIUM CHLORIDE 0.9 % IV BOLUS
1000.0000 mL | Freq: Once | INTRAVENOUS | Status: AC
  Administered 2020-06-03: 1000 mL via INTRAVENOUS

## 2020-06-03 NOTE — ED Provider Notes (Signed)
[facility]   CSN: [id number] Arrival date & time: 06/02/20  2201     History Chief Complaint  Patient presents with  . Back Pain  . Flank Pain    [name] is a 40 y.o. female.  Patient presents with a 3-day history of right-sided flank, low back and abdominal pain.  She reports the pain comes and goes lasting for several minutes to hours at a time.  Nothing makes it better nothing makes it worse.  Denies any nausea or vomiting.  No fever.  No pain with urination or blood in the urine.  No vaginal bleeding or discharge.  Normal bowel movements.  Still has a good appetite.  She is never had this kind of pain before. Still has gallbladder and appendix.  Still has uterus and ovaries.  The history is provided by the patient.  Back Pain Associated symptoms: abdominal pain   Associated symptoms: no dysuria, no fever, no headaches and no weakness   Flank Pain Associated symptoms include abdominal pain. Pertinent negatives include no headaches and no shortness of breath.       Past Medical History:  Diagnosis Date  . No pertinent past medical history     There are no problems to display for this patient.   Past Surgical History:  Procedure Laterality Date  . CESAREAN SECTION  2004  . DILATION AND CURETTAGE OF UTERUS  2007  . TUBAL LIGATION  2008     OB History    Gravida  4   Para  3   Term  0   Preterm      AB  1   Living  3     SAB  1   IAB      Ectopic      Multiple      Live Births              Family History  Problem Relation Age of Onset  . Hypertension Mother   . Stroke Mother   . Hyperlipidemia Mother   . Seizures Mother   . Kidney failure Father     Social History   Tobacco Use  . Smoking status: Former Smoker    Types: Cigars  . Smokeless tobacco: Never Used  Substance Use Topics  . Alcohol use: Yes  . Drug use: No    Home Medications Prior to Admission  medications   Medication Sig Start Date End Date Taking? Authorizing Provider  albuterol (PROAIR HFA) 108 (90 Base) MCG/ACT inhaler Inhale 1-2 puffs into the lungs every 6 (six) hours as needed for wheezing or shortness of breath. 09/23/19   Georgetta HaberBurky, Natalie B, NP  benzonatate (TESSALON) 100 MG capsule Take 1 capsule (100 mg total) by mouth every 8 (eight) hours. 09/23/19   Georgetta HaberBurky, Natalie B, NP  chlorhexidine (PERIDEX) 0.12 % solution Use as directed 15 mLs in the mouth or throat 2 (two) times daily. Swish and spit.  Do not swallow. Patient not taking: Reported on 09/16/2019 09/09/18   Aviva KluverMurray, Alyssa B, PA-C  diclofenac sodium (VOLTAREN) 1 % GEL Apply 2 g topically 4 (four) times daily. Patient not taking: Reported on 09/16/2019 04/22/18   Aviva KluverMurray, Alyssa B, PA-C  ibuprofen (ADVIL,MOTRIN) 800 MG tablet Take 1 tablet (800 mg total) by mouth every 8 (eight) hours as needed for mild pain. Patient not taking: Reported on 09/16/2019 07/01/17   Ward, Layla MawKristen N, DO  lidocaine (XYLOCAINE) 2 % solution Use  as directed 15 mLs in the mouth or throat as needed for mouth pain. Swish and spit.  Do not swallow. Patient not taking: Reported on 09/16/2019 09/09/18   Aviva Kluver B, PA-C  methocarbamol (ROBAXIN) 500 MG tablet Take 1 tablet (500 mg total) by mouth every 8 (eight) hours as needed for muscle spasms. Patient not taking: Reported on 09/16/2019 07/01/17   Ward, Layla Maw, DO  predniSONE (STERAPRED UNI-PAK 21 TAB) 10 MG (21) TBPK tablet Take as directed Patient not taking: Reported on 09/16/2019 07/01/17   Ward, Layla Maw, DO  pseudoephedrine-acetaminophen (TYLENOL SINUS) 30-500 MG TABS tablet Take 1 tablet by mouth every 4 (four) hours as needed.    [provider]  traMADol (ULTRAM) 50 MG tablet Take 1 tablet (50 mg total) every 6 (six) hours as needed by mouth. Patient not taking: Reported on 09/16/2019 02/02/17   Hayden Rasmussen, NP    Allergies    Shellfish allergy and Iodine  Review of Systems   Review of Systems   Constitutional: Negative for activity change, appetite change, fatigue and fever.  HENT: Negative for congestion and rhinorrhea.   Eyes: Negative for visual disturbance.  Respiratory: Negative for cough, chest tightness and shortness of breath.   Gastrointestinal: Positive for abdominal pain. Negative for nausea and vomiting.  Genitourinary: Positive for flank pain. Negative for dysuria.  Musculoskeletal: Positive for back pain.  Skin: Negative for rash.  Neurological: Negative for dizziness, weakness and headaches.   all other systems are negative except as noted in the HPI and PMH.    Physical Exam Updated Vital Signs BP 125/66   Pulse 79   Temp 98.3 F (36.8 C)   Resp 17   Ht 4\' 11"  (1.499 m)   Wt 72.6 kg   LMP 05/23/2020   SpO2 98%   BMI 32.33 kg/m   Physical Exam Vitals and nursing note reviewed.  Constitutional:      General: She is not in acute distress.    Appearance: She is well-developed.  HENT:     Head: Normocephalic and atraumatic.     Mouth/Throat:     Pharynx: No oropharyngeal exudate.  Eyes:     Conjunctiva/sclera: Conjunctivae normal.     Pupils: Pupils are equal, round, and reactive to light.  Neck:     Comments: No meningismus. Cardiovascular:     Rate and Rhythm: Normal rate and regular rhythm.     Heart sounds: Normal heart sounds. No murmur heard.   Pulmonary:     Effort: Pulmonary effort is normal. No respiratory distress.     Breath sounds: Normal breath sounds.  Abdominal:     Palpations: Abdomen is soft.     Tenderness: There is abdominal tenderness. There is no guarding or rebound.     Comments: Right lower abdominal periumbilical tenderness, no guarding or rebound.  Genitourinary:    Comments: Chaperone present.  Normal external genitalia.  White discharge in vaginal vault.  No CMT.  There is midline and right-sided adnexal pain.  No left-sided pain. Musculoskeletal:        General: Tenderness present. Normal range of motion.      Cervical back: Normal range of motion and neck supple.     Comments: R CVAT  Skin:    General: Skin is warm.  Neurological:     Mental Status: She is alert and oriented to person, place, and time.     Cranial Nerves: No cranial nerve deficit.     Motor: No  abnormal muscle tone.     Coordination: Coordination normal.     Comments: No ataxia on finger to nose bilaterally. No pronator drift. 5/5 strength throughout. CN 2-12 intact.Equal grip strength. Sensation intact.   Psychiatric:        Behavior: Behavior normal.     ED Results / Procedures / Treatments   Labs (all labs ordered are listed, but only abnormal results are displayed) Labs Reviewed  WET PREP, GENITAL - Abnormal; Notable for the following components:      Result Value   Clue Cells Wet Prep HPF POC PRESENT (*)    WBC, Wet Prep HPF POC FEW (*)    All other components within normal limits  URINALYSIS, ROUTINE W REFLEX MICROSCOPIC - Abnormal; Notable for the following components:   APPearance CLOUDY (*)    All other components within normal limits  BASIC METABOLIC PANEL - Abnormal; Notable for the following components:   CO2 20 (*)    Glucose, Bld 115 (*)    All other components within normal limits  CBC - Abnormal; Notable for the following components:   RBC 3.74 (*)    Hemoglobin 11.8 (*)    All other components within normal limits  I-STAT BETA HCG BLOOD, ED ([facility], [facility], [facility] ONLY)  GC/CHLAMYDIA PROBE AMP ([place]) NOT AT [facility]    EKG None  Radiology CT ABDOMEN PELVIS WO CONTRAST  Result Date: 06/03/2020 CLINICAL DATA:  Right lower quadrant abdominal pain EXAM: CT ABDOMEN AND PELVIS WITHOUT CONTRAST TECHNIQUE: Multidetector CT imaging of the abdomen and pelvis was performed following the standard protocol without IV contrast. COMPARISON:  None. FINDINGS: Lower chest: Lung bases are clear. Normal heart size. No pericardial effusion. Hepatobiliary: 4.6 by 3.6 by 4.4 cm subcapsular, intermediate attenuation lesion in  the right lobe liver with some questionable internal complexity, incompletely characterized on this examination. No other focal visible liver lesion. Normal liver attenuation. Smooth liver surface contour. Gallbladder partially decompressed. No pericholecystic fluid, inflammation, biliary ductal dilatation or visible calcified gallstones. Pancreas: No pancreatic ductal dilatation or surrounding inflammatory changes. Spleen: Normal in size. No concerning splenic lesions. Adrenals/Urinary Tract: Normal adrenal glands. No visible or contour deforming renal lesions. No urolithiasis or hydronephrosis. Stomach/Bowel: Distal esophagus, stomach and duodenal sweep are unremarkable. No small bowel wall thickening or dilatation. No evidence of obstruction. A normal appendix is visualized. No colonic dilatation or wall thickening. Vascular/Lymphatic: No significant vascular findings are present. No enlarged abdominal or pelvic lymph nodes. Reproductive: Normal anteverted uterus. Few normal follicle seen in the right adnexa. No concerning adnexal lesions. Other: No abdominopelvic free fluid or free gas. No bowel containing hernias. Musculoskeletal: No acute osseous abnormality or suspicious osseous lesion. IMPRESSION: 1. No acute intra-abdominal process to provide a cause for patient's right lower quadrant pain. Specifically, the appendix is normal. 2. 4.6 cm subcapsular, intermediate attenuation lesion in the right lobe liver with some questionable internal complexity, incompletely characterized on this examination. Recommend further evaluation with nonemergent, outpatient MRI with and without contrast. Electronically Signed   By: Kreg Shropshire M.D.   On: 06/03/2020 04:46   US PELVIC COMPLETE W TRANSVAGINAL AND TORSION R/O  Result Date: 06/03/2020 CLINICAL DATA:  Right lower quadrant pain EXAM: TRANSABDOMINAL AND TRANSVAGINAL ULTRASOUND OF PELVIS DOPPLER ULTRASOUND OF OVARIES TECHNIQUE: Both transabdominal and transvaginal  ultrasound examinations of the pelvis were performed. Transabdominal technique was performed for global imaging of the pelvis including uterus, ovaries, adnexal regions, and pelvic cul-de-sac. It was necessary to proceed with endovaginal  exam following the transabdominal exam to visualize the endometrium and ovaries. Color and duplex Doppler ultrasound was utilized to evaluate blood flow to the ovaries. COMPARISON:  CT abdomen pelvis 06/03/2020 FINDINGS: Uterus Measurements: 10 x 4.4 x 5.8 cm = volume: 134.4 mL. Minimal nonspecific heterogeneity of the myometrium without concerning features. No discrete uterine mass or fibroid. Endometrium Thickness: 7.7 mm, non thickened with a trilaminar appearance suggestive of a late proliferative phase. No focal abnormality visualized. Right ovary Measurements: 3.6 x 2.1 x 1.8 cm = volume: 7.1 mL. 2.1 x 1.2 x 1.5 cm anechoic follicle within the right adnexa. A smaller 1.4 x 1.5 x 0.8 cm anechoic cyst is seen adjacent the ovary, possibly a small paraovarian cyst. Left ovary Measurements: 2.9 x 1.3 x 1.9 cm = volume: 3.9 ML. Normal appearance with normal follicles. Pulsed Doppler evaluation of both ovaries demonstrates normal low-resistance arterial and venous waveforms. Other findings Small volume of anechoic free fluid anterior to the uterus and within the adnexa, nonspecific. IMPRESSION: 1. No convincing acute pelvic abnormality is seen. 2. Small volume of anechoic free fluid anterior to the uterus and within the adnexa, nonspecific and possibly physiologic in a reproductive age female. 3. Dominant follicle in the right ovary with additional likely small paraovarian cyst. No routine followup imaging recommended (reference: Radiology 2019 Nov; 293(2):359-371). Though if pain or symptoms persist, follow-up ultrasound in 6-8 weeks could be considered. 4. Unremarkable Doppler evaluation of the ovaries without evidence of torsion. Electronically Signed   By: Kreg Shropshire M.D.   On:  06/03/2020 06:31    Procedures Procedures   Medications Ordered in ED Medications  sodium chloride 0.9 % bolus 1,000 mL (has no administration in time range)  ondansetron (ZOFRAN) injection 4 mg (has no administration in time range)    ED Course  I have reviewed the triage vital signs and the nursing notes.  Pertinent labs & imaging results that were available during my care of the patient were reviewed by me and considered in my medical decision making (see chart for details).    MDM Rules/Calculators/A&P                         R sided abdominal pain for the past several days.  No associated symptoms. Abdomen soft. No peritoneal signs.  hCG is negative.  Urinalysis is negative.  Labs reassuring.  No leukocytosis.  Appendix appears normal on CT scan.  We will proceed with ultrasound to evaluate ovary. Liver lesion noted will need follow-up.  Ultrasound shows no evidence of ovarian torsion.  Does show right ovarian follicle which may be source of her pain.  Advised follow-up ultrasound in 6 to 8 weeks. We will treat bacterial vaginosis  Follow up with PCP. followup for ovarian cyst and liver lesions.  Return to the ED with worsening abdominal pain, especially on the right side, fever, vomiting, or any other concerns.  Final Clinical Impression(s) / ED Diagnoses Final diagnoses:  RLQ abdominal pain  Bacterial vaginosis    Rx / DC Orders ED Discharge Orders    None       Rancour, Jeannett Senior, MD 06/03/20 [phone]

## 2020-06-03 NOTE — ED Notes (Signed)
Patient transported to CT via stretcher in stable condition 

## 2020-06-03 NOTE — ED Notes (Signed)
Pt tolerating apple juice.

## 2020-06-03 NOTE — Discharge Instructions (Addendum)
Your appendix appears normal. Your CT scan did show some lesions in your liver that need follow-up with an MRI by your primary doctor. You also have an ovarian cyst that may be causing your pain. You should have another ultrasound of your ovary in 6-8 weeks. Take the antibiotics as prescribed. Followup with your doctor. Return to the ED with new or worsening symptoms.

## 2020-06-03 NOTE — ED Notes (Signed)
Patient transported back from CT 

## 2020-07-13 ENCOUNTER — Encounter: Payer: Self-pay | Admitting: Obstetrics and Gynecology

## 2020-07-13 ENCOUNTER — Other Ambulatory Visit (HOSPITAL_COMMUNITY)
Admission: RE | Admit: 2020-07-13 | Discharge: 2020-07-13 | Disposition: A | Discharge status: Home / Self Care | Payer: Medicaid Other | Source: Ambulatory Visit | Attending: Obstetrics and Gynecology | Admitting: Obstetrics and Gynecology

## 2020-07-13 ENCOUNTER — Other Ambulatory Visit: Payer: Self-pay

## 2020-07-13 ENCOUNTER — Ambulatory Visit (INDEPENDENT_AMBULATORY_CARE_PROVIDER_SITE_OTHER): Payer: Medicaid Other | Admitting: Obstetrics and Gynecology

## 2020-07-13 VITALS — BP 145/88 | HR 76 | Ht 59.0 in | Wt 152.6 lb

## 2020-07-13 DIAGNOSIS — Z124 Encounter for screening for malignant neoplasm of cervix: Secondary | ICD-10-CM | POA: Insufficient documentation

## 2020-07-13 DIAGNOSIS — Z113 Encounter for screening for infections with a predominantly sexual mode of transmission: Secondary | ICD-10-CM | POA: Insufficient documentation

## 2020-07-13 DIAGNOSIS — Z9851 Tubal ligation status: Secondary | ICD-10-CM | POA: Insufficient documentation

## 2020-07-13 DIAGNOSIS — Z202 Contact with and (suspected) exposure to infections with a predominantly sexual mode of transmission: Secondary | ICD-10-CM | POA: Diagnosis not present

## 2020-07-13 DIAGNOSIS — Z01419 Encounter for gynecological examination (general) (routine) without abnormal findings: Secondary | ICD-10-CM | POA: Diagnosis not present

## 2020-07-13 DIAGNOSIS — Z98891 History of uterine scar from previous surgery: Secondary | ICD-10-CM | POA: Insufficient documentation

## 2020-07-13 DIAGNOSIS — N920 Excessive and frequent menstruation with regular cycle: Secondary | ICD-10-CM | POA: Insufficient documentation

## 2020-07-13 MED ORDER — NORETHIN ACE-ETH ESTRAD-FE 1-20 MG-MCG(24) PO TABS: 1.0000 | ORAL_TABLET | Freq: Every day | ORAL | 11 refills | Status: DC

## 2020-07-13 NOTE — Progress Notes (Signed)
[name] is a 40 y.o. [phone] female here for a routine annual gynecologic exam. Heavy cycles. Cycles monthly last 4-5 days. Sexual active without probelms    Gynecologic History Patient's last menstrual period was 07/05/2020 (approximate). Contraception: tubal ligation Last Pap: Unknown.  Obstetric History OB History  Gravida Para Term Preterm AB Living  4 3 0   1 3  SAB IAB Ectopic Multiple Live Births  1            # Outcome Date GA Lbr Len/2nd Weight Sex Delivery Anes PTL Lv  4 Para 2008     Vag-Spont     3 Para 2006     Vag-Spont     2 Para 2004     CS-LTranv     1 SAB             Past Medical History:  Diagnosis Date  . No pertinent past medical history     Past Surgical History:  Procedure Laterality Date  . CESAREAN SECTION  2004  . DILATION AND CURETTAGE OF UTERUS  2007  . TUBAL LIGATION  2008    Current Outpatient Medications on File Prior to Visit  Medication Sig Dispense Refill  . ibuprofen (ADVIL) 100 MG/5ML suspension Take 200 mg by mouth every 4 (four) hours as needed for mild pain (headache).     No current facility-administered medications on file prior to visit.    Allergies  Allergen Reactions  . Shellfish Allergy Anaphylaxis  . Iodine Itching    Social History   Socioeconomic History  . Marital status: Single    Spouse name: Not on file  . Number of children: Not on file  . Years of education: Not on file  . Highest education level: Not on file  Occupational History  . Not on file  Tobacco Use  . Smoking status: Former Smoker    Types: Cigars  . Smokeless tobacco: Never Used  Substance and Sexual Activity  . Alcohol use: Yes  . Drug use: No  . Sexual activity: Yes    Birth control/protection: Surgical  Other Topics Concern  . Not on file  Social History Narrative  . Not on file   Social Determinants of Health   Financial Resource Strain: Not on file  Food Insecurity: Not on file  Transportation Needs: Not on  file  Physical Activity: Not on file  Stress: Not on file  Social Connections: Not on file  Intimate Partner Violence: Not on file    Family History  Problem Relation Age of Onset  . Hypertension Mother   . Stroke Mother   . Hyperlipidemia Mother   . Seizures Mother   . Kidney failure Father     The following portions of the patient's history were reviewed and updated as appropriate: allergies, current medications, past family history, past medical history, past social history, past surgical history and problem list.  Review of Systems Pertinent items noted in HPI and remainder of comprehensive ROS otherwise negative.   Objective:  BP (!) 145/88 (BP Location: Right Arm)   Pulse 76   Ht 4\' 11"  (1.499 m)   Wt 152 lb 9.6 oz (69.2 kg)   LMP 07/05/2020 (Approximate)   BMI 30.82 kg/m  CONSTITUTIONAL: Well-developed, well-nourished female in no acute distress.  HENT:  Normocephalic, atraumatic, External right and left ear normal. Oropharynx is clear and moist EYES: Conjunctivae and EOM are normal. Pupils are equal, round, and reactive to light. No scleral icterus.  NECK: Normal range of motion, supple, no masses.  Normal thyroid.  SKIN: Skin is warm and dry. No rash noted. Not diaphoretic. No erythema. No pallor. NEUROLGIC: Alert and oriented to person, place, and time. Normal reflexes, muscle tone coordination. No cranial nerve deficit noted. PSYCHIATRIC: Normal mood and affect. Normal behavior. Normal judgment and thought content. CARDIOVASCULAR: Normal heart rate noted, regular rhythm RESPIRATORY: Clear to auscultation bilaterally. Effort and breath sounds normal, no problems with respiration noted. BREASTS: Symmetric in size. No masses, skin changes, nipple drainage, or lymphadenopathy. ABDOMEN: Soft, normal bowel sounds, no distention noted.  No tenderness, rebound or guarding.  PELVIC: Normal appearing external genitalia; normal appearing vaginal mucosa and cervix.  No abnormal  discharge noted.  Pap smear obtained.  Normal uterine size, no other palpable masses, no uterine or adnexal tenderness. MUSCULOSKELETAL: Normal range of motion. No tenderness.  No cyanosis, clubbing, or edema.  2+ distal pulses.   Assessment:  Annual gynecologic examination with pap smear  Menorrhagia with regular cycles STD exposure Plan:  Will follow up results of pap smear and manage accordingly. STD testing as per pt's request.  Discussed treatment options for heavy cycle. Following discussion pt desires to try OCP. U/R/B reviewed with pt.  Routine preventative health maintenance measures emphasized. Please refer to After Visit Summary for other counseling recommendations.  F/U in 3-4 months to evaluate response to OCP's  Hermina Staggers, MD, FACOG Attending Obstetrician & Gynecologist Center for [facility], [facility]

## 2020-07-13 NOTE — Patient Instructions (Signed)
Health Maintenance, Female Adopting a healthy lifestyle and getting preventive care are important in promoting health and wellness. Ask your health care provider about:  The right schedule for you to have regular tests and exams.  Things you can do on your own to prevent diseases and keep yourself healthy. What should I know about diet, weight, and exercise? Eat a healthy diet  Eat a diet that includes plenty of vegetables, fruits, low-fat dairy products, and lean protein.  Do not eat a lot of foods that are high in solid fats, added sugars, or sodium.   Maintain a healthy weight Body mass index (BMI) is used to identify weight problems. It estimates body fat based on height and weight. Your health care provider can help determine your BMI and help you achieve or maintain a healthy weight. Get regular exercise Get regular exercise. This is one of the most important things you can do for your health. Most adults should:  Exercise for at least 150 minutes each week. The exercise should increase your heart rate and make you sweat (moderate-intensity exercise).  Do strengthening exercises at least twice a week. This is in addition to the moderate-intensity exercise.  Spend less time sitting. Even light physical activity can be beneficial. Watch cholesterol and blood lipids Have your blood tested for lipids and cholesterol at 39 years of age, then have this test every 5 years. Have your cholesterol levels checked more often if:  Your lipid or cholesterol levels are high.  You are older than 40 years of age.  You are at high risk for heart disease. What should I know about cancer screening? Depending on your health history and family history, you may need to have cancer screening at various ages. This may include screening for:  Breast cancer.  Cervical cancer.  Colorectal cancer.  Skin cancer.  Lung cancer. What should I know about heart disease, diabetes, and high blood  pressure? Blood pressure and heart disease  High blood pressure causes heart disease and increases the risk of stroke. This is more likely to develop in people who have high blood pressure readings, are of African descent, or are overweight.  Have your blood pressure checked: ? Every 3-5 years if you are 18-39 years of age. ? Every year if you are 40 years old or older. Diabetes Have regular diabetes screenings. This checks your fasting blood sugar level. Have the screening done:  Once every three years after age 40 if you are at a normal weight and have a low risk for diabetes.  More often and at a younger age if you are overweight or have a high risk for diabetes. What should I know about preventing infection? Hepatitis B If you have a higher risk for hepatitis B, you should be screened for this virus. Talk with your health care provider to find out if you are at risk for hepatitis B infection. Hepatitis C Testing is recommended for:  Everyone born from 1945 through 1965.  Anyone with known risk factors for hepatitis C. Sexually transmitted infections (STIs)  Get screened for STIs, including gonorrhea and chlamydia, if: ? You are sexually active and are younger than 39 years of age. ? You are older than 39 years of age and your health care provider tells you that you are at risk for this type of infection. ? Your sexual activity has changed since you were last screened, and you are at increased risk for chlamydia or gonorrhea. Ask your health care provider   if you are at risk.  Ask your health care provider about whether you are at high risk for HIV. Your health care provider may recommend a prescription medicine to help prevent HIV infection. If you choose to take medicine to prevent HIV, you should first get tested for HIV. You should then be tested every 3 months for as long as you are taking the medicine. Pregnancy  If you are about to stop having your period (premenopausal) and  you may become pregnant, seek counseling before you get pregnant.  Take 400 to 800 micrograms (mcg) of folic acid every day if you become pregnant.  Ask for birth control (contraception) if you want to prevent pregnancy. Osteoporosis and menopause Osteoporosis is a disease in which the bones lose minerals and strength with aging. This can result in bone fractures. If you are 65 years old or older, or if you are at risk for osteoporosis and fractures, ask your health care provider if you should:  Be screened for bone loss.  Take a calcium or vitamin D supplement to lower your risk of fractures.  Be given hormone replacement therapy (HRT) to treat symptoms of menopause. Follow these instructions at home: Lifestyle  Do not use any products that contain nicotine or tobacco, such as cigarettes, e-cigarettes, and chewing tobacco. If you need help quitting, ask your health care provider.  Do not use street drugs.  Do not share needles.  Ask your health care provider for help if you need support or information about quitting drugs. Alcohol use  Do not drink alcohol if: ? Your health care provider tells you not to drink. ? You are pregnant, may be pregnant, or are planning to become pregnant.  If you drink alcohol: ? Limit how much you use to 0-1 drink a day. ? Limit intake if you are breastfeeding.  Be aware of how much alcohol is in your drink. In the [place] one drink equals one 12 oz bottle of beer (355 mL), one 5 oz glass of wine (148 mL), or one 1 oz glass of hard liquor (44 mL). General instructions  Schedule regular health, dental, and eye exams.  Stay current with your vaccines.  Tell your health care provider if: ? You often feel depressed. ? You have ever been abused or do not feel safe at home. Summary  Adopting a healthy lifestyle and getting preventive care are important in promoting health and wellness.  Follow your health care provider's instructions about healthy  diet, exercising, and getting tested or screened for diseases.  Follow your health care provider's instructions on monitoring your cholesterol and blood pressure. This information is not intended to replace advice given to you by your health care provider. Make sure you discuss any questions you have with your health care provider. Document Revised: 02/24/2018 Document Reviewed: 02/24/2018 Elsevier Patient Education  2021 Elsevier Inc.  

## 2020-07-14 LAB — HEPATITIS C ANTIBODY: Hep C Virus Ab: <0.1 s/co ratio (ref 0.0–0.9)

## 2020-07-14 LAB — HEPATITIS B SURFACE ANTIGEN: Hepatitis B Surface Ag: NEGATIVE

## 2020-07-14 LAB — HIV ANTIBODY (ROUTINE TESTING W REFLEX): HIV Screen 4th Generation wRfx: NONREACTIVE

## 2020-07-14 LAB — RPR: RPR Ser Ql: NONREACTIVE

## 2020-07-18 LAB — CYTOLOGY - PAP
Chlamydia: NEGATIVE
Comment: NEGATIVE
Comment: NEGATIVE
Comment: NEGATIVE
Comment: NEGATIVE
Comment: NORMAL
HPV 16: NEGATIVE
HPV 18 / 45: NEGATIVE
High risk HPV: POSITIVE — AB
Neisseria Gonorrhea: NEGATIVE
Trichomonas: NEGATIVE

## 2020-07-19 ENCOUNTER — Encounter: Payer: Self-pay | Admitting: Obstetrics and Gynecology

## 2020-07-19 DIAGNOSIS — R87612 Low grade squamous intraepithelial lesion on cytologic smear of cervix (LGSIL): Secondary | ICD-10-CM | POA: Insufficient documentation

## 2020-08-22 ENCOUNTER — Other Ambulatory Visit: Payer: Self-pay

## 2020-08-22 ENCOUNTER — Ambulatory Visit (INDEPENDENT_AMBULATORY_CARE_PROVIDER_SITE_OTHER): Payer: Medicaid Other | Admitting: Obstetrics and Gynecology

## 2020-08-22 ENCOUNTER — Encounter: Payer: Self-pay | Admitting: Obstetrics and Gynecology

## 2020-08-22 ENCOUNTER — Other Ambulatory Visit (HOSPITAL_COMMUNITY)
Admission: RE | Admit: 2020-08-22 | Discharge: 2020-08-22 | Disposition: A | Discharge status: Home / Self Care | Payer: Medicaid Other | Source: Ambulatory Visit | Attending: Obstetrics and Gynecology | Admitting: Obstetrics and Gynecology

## 2020-08-22 VITALS — Ht 59.0 in | Wt 153.5 lb

## 2020-08-22 DIAGNOSIS — Z3202 Encounter for pregnancy test, result negative: Secondary | ICD-10-CM

## 2020-08-22 DIAGNOSIS — R87612 Low grade squamous intraepithelial lesion on cytologic smear of cervix (LGSIL): Secondary | ICD-10-CM | POA: Diagnosis not present

## 2020-08-22 LAB — POCT PREGNANCY, URINE: Preg Test, Ur: NEGATIVE

## 2020-08-22 NOTE — Progress Notes (Signed)
    GYNECOLOGY CLINIC COLPOSCOPY PROCEDURE NOTE  40 y.o. [id number] here for colposcopy for low-grade squamous intraepithelial neoplasia (LGSIL - encompassing HPV,mild dysplasia,CIN I) pap smear on 06/2020. Discussed role for HPV in cervical dysplasia, need for surveillance.  Patient given informed consent, signed copy in the chart, time out was performed.  Placed in lithotomy position. Cervix viewed with speculum and colposcope after application of acetic acid.   Colposcopy adequate? Yes  visible lesion(s) at 12 & 6  o'clock; corresponding biopsies obtained.  ECC specimen obtained. All specimens were labelled and sent to pathology. Monsel's applied   Patient was given post procedure instructions.  Will follow up pathology and manage accordingly.  Routine preventative health maintenance measures emphasized.    Nettie Elm, MD, FACOG Attending Obstetrician & Gynecologist Center for [facility], [facility]

## 2020-08-22 NOTE — Patient Instructions (Signed)

## 2020-08-28 LAB — SURGICAL PATHOLOGY

## 2020-09-26 ENCOUNTER — Encounter (INDEPENDENT_AMBULATORY_CARE_PROVIDER_SITE_OTHER): Payer: Self-pay | Admitting: Primary Care

## 2020-09-26 ENCOUNTER — Telehealth (INDEPENDENT_AMBULATORY_CARE_PROVIDER_SITE_OTHER): Payer: Medicaid Other | Admitting: Primary Care

## 2020-09-26 DIAGNOSIS — Z7689 Persons encountering health services in other specified circumstances: Secondary | ICD-10-CM

## 2020-09-26 DIAGNOSIS — R519 Headache, unspecified: Secondary | ICD-10-CM

## 2020-09-26 NOTE — Progress Notes (Signed)
  [facility]  Virtual Visit via Telephone Note  I connected with [name], on 09/26/2020 at 8:58 AM through an audio and video application or by virtual and verified that I am speaking with the correct person using two identifiers.   Consent: I discussed the limitations, risks, security and privacy concerns of performing an evaluation and management service by telephone and the availability of in person appointments. I also discussed with the patient that there may be a patient responsible charge related to this service. The patient expressed understanding and agreed to proceed.   Location of Patient: Home  Location of Provider: [facility]   Persons participating in Telemedicine visit: [name],  NP Cristela Felt , CMA  History of Present Illness: [name] is a 40 year old female who is establishing care. She has ongoing headache some are photosensitive- she recently stopped smoking a week ago. Denies coffee, drinks sodas daily -fantana , spirit or minute maid, some alcohol but not on a regular bases- wine no age cheese. Strong odor can contribute. She admits to worrying a lot. Discuss    Past Medical History:  Diagnosis Date   No pertinent past medical history    Allergies  Allergen Reactions   Shellfish Allergy Anaphylaxis   Iodine Itching    Current Outpatient Medications on File Prior to Visit  Medication Sig Dispense Refill   ibuprofen (ADVIL) 100 MG/5ML suspension Take 200 mg by mouth every 4 (four) hours as needed for mild pain (headache).     Norethindrone Acetate-Ethinyl Estrad-FE (LOESTRIN 24 FE) 1-20 MG-MCG(24) tablet Take 1 tablet by mouth daily. 28 tablet 11   No current facility-administered medications on file prior to visit.    Observations/Objective: There were no vitals taken for this visit. See HPI  Assessment and Plan: Diagnoses  and all orders for this visit:  Encounter to establish care Establish care with PCP last exam 13 years ago   Nonintractable episodic headache, unspecified headache type if worsening HA, changes vision/speech, imbalance, weakness go to the ER . Note triggers and discuss at f/u Takes baby ibuprofen which gives some relief. She does not like swallowing pills.    Follow Up Instructions: Schedule PE and fasting labs   I discussed the assessment and treatment plan with the patient. The patient was provided an opportunity to ask questions and all were answered. The patient agreed with the plan and demonstrated an understanding of the instructions.   The patient was advised to call back or seek an in-person evaluation if the symptoms worsen or if the condition fails to improve as anticipated.     I provided 10 minutes total of non-face-to-face time during this encounter including median intraservice time, reviewing previous notes, investigations, ordering medications, medical decision making, coordinating care and patient verbalized understanding at the end of the visit.    This note has been created with Education officer, environmental. Any transcriptional errors are unintentional.   Grayce Sessions, NP 09/26/2020, 8:58 AM

## 2020-10-09 ENCOUNTER — Encounter (INDEPENDENT_AMBULATORY_CARE_PROVIDER_SITE_OTHER): Payer: Medicaid Other | Admitting: Primary Care

## 2020-10-19 ENCOUNTER — Encounter (INDEPENDENT_AMBULATORY_CARE_PROVIDER_SITE_OTHER): Payer: Medicaid Other | Admitting: Primary Care

## 2020-11-08 ENCOUNTER — Encounter (INDEPENDENT_AMBULATORY_CARE_PROVIDER_SITE_OTHER): Payer: Medicaid Other | Admitting: Primary Care

## 2020-11-12 ENCOUNTER — Encounter: Payer: Self-pay | Admitting: Obstetrics and Gynecology

## 2020-11-12 ENCOUNTER — Other Ambulatory Visit: Payer: Self-pay

## 2020-11-12 ENCOUNTER — Ambulatory Visit (INDEPENDENT_AMBULATORY_CARE_PROVIDER_SITE_OTHER): Payer: Medicaid Other | Admitting: Obstetrics and Gynecology

## 2020-11-12 DIAGNOSIS — N870 Mild cervical dysplasia: Secondary | ICD-10-CM | POA: Insufficient documentation

## 2020-11-12 HISTORY — DX: Mild cervical dysplasia: N87.0

## 2020-11-12 LAB — POCT PREGNANCY, URINE: Preg Test, Ur: NEGATIVE

## 2020-11-12 NOTE — Patient Instructions (Signed)
Cryoablation, Care After ?This sheet gives you information about how to care for yourself after your procedure. Your health care provider may also give you more specific instructions. If you have problems or questions, contact your health care provider. ?What can I expect after the procedure? ?After the procedure, it is common to have: ?Soreness around the treatment area. ?Mild pain and swelling in the treatment area. ?Follow these instructions at home: ?Treatment area care ? ?If you have an incision, follow instructions from your health care provider about how to take care of it. Make sure you: ?Wash your hands with soap and water for at least 20 seconds before and after you change your bandage (dressing). If soap and water are not available, use hand sanitizer. ?Change your dressing as told by your health care provider. ?Leave stitches (sutures), skin glue, or adhesive strips in place. These skin closures may need to stay in place for 2 weeks or longer. If adhesive strip edges start to loosen and curl up, you may trim the loose edges. Do not remove adhesive strips completely unless your health care provider tells you to do that. ?Check your treatment area every day for signs of infection. Check for: ?More redness, swelling, or pain. ?Fluid or blood. ?Warmth. ?Pus or a bad smell. ?Keep the treated area clean, dry, and covered with a dressing until it has healed. Clean the area with soap and water or as told by your health care provider. If your bandage gets wet, change it right away. ?Do not take baths, swim, or use a hot tub until your health care provider approves. Ask your health care provider if you may take showers. You may only be allowed to take sponge baths. ?Activity ? ?Follow instructions from your health care provider about any activity limitations, including lifting heavy objects. ?If you were given a sedative during the procedure, it can affect you for several hours. Do not drive or operate machinery  until your health care provider says that it is safe. ?General instructions ?Take over-the-counter and prescription medicines only as told by your health care provider. ?Do not use any products that contain nicotine or tobacco, such as cigarettes, e-cigarettes, and chewing tobacco. These can delay incision healing. If you need help quitting, ask your health care provider. ?Keep all follow-up visits as told by your health care provider. This is important. ?Contact a health care provider if: ?You have increased pain. ?You have a fever. ?You have nausea or vomiting. ?You have any of these signs of infection: ?More redness, swelling, or pain around your treatment area. ?Fluid or blood coming from your treatment area. ?Warmth coming from your treatment area. ?Pus or a bad smell coming from your treatment area. ?You do not have a bowel movement for 2 days. ?Get help right away if you have: ?Severe pain. ?Trouble swallowing or breathing. ?Severe weakness or dizziness. ?Chest pain or shortness of breath. ?These symptoms may represent a serious problem that is an emergency. Do not wait to see if the symptoms will go away. Get medical help right away. Call your local emergency services (911 in the [place] Do not drive yourself to the hospital. ?Summary ?After the procedure, it is common for the treatment area to be sore, mildly painful, and swollen. ?If you have a dressing, change it as told by your health care provider. ?Follow instructions from your health care provider about any activity limitations. ?Contact a health care provider if you have increased pain or a fever. ?This information is   not intended to replace advice given to you by your health care provider. Make sure you discuss any questions you have with your health care provider. ?Document Revised: 12/09/2018 Document Reviewed: 12/09/2018 ?Elsevier Patient Education ? 2022 Elsevier Inc. ? ?

## 2020-11-12 NOTE — Progress Notes (Signed)
    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 She was counseled about that efficacy of this procedure, and possible need for excisional procedure in the future if her cervical dysplasia persists.  The risks of the procedure where explained in detail and patient was told to expect a copious amount of discharge in the next few weeks. All her questions were answered, and written informed consent was obtained.  The patient was placed in the dorsal lithotomy position and a vaginal speculum was placed. Her cervix was visualized and patient was noted to have had normal size transformation zone. The appropriate cryotherapy probe was picked and affixed to cryotherapy apparatus. Then nitrogen gas was then activated, the probe was coated with lubricating jelly and applied to the transformation zone of the cervix. This was kept in place for 3 minutes. The cryotherapy was then stopped and all instruments were removed from the patient's pelvis; a thawing period of 3 minutes was observed.  A second cycle of cryotherapy was then administered to the cervix for 3 minutes.  The patient tolerated the procedure well without any complications. Routine post procedure instructions were given to the patient.  Will repeat pap smear in 6 months and manage accordingly.   Nettie Elm, MD, FACOG Attending Obstetrician & Gynecologist Center for [facility], [facility]

## 2020-11-22 ENCOUNTER — Telehealth: Payer: Medicaid Other

## 2020-12-10 ENCOUNTER — Encounter: Payer: Self-pay | Admitting: Obstetrics and Gynecology

## 2020-12-10 ENCOUNTER — Ambulatory Visit (INDEPENDENT_AMBULATORY_CARE_PROVIDER_SITE_OTHER): Payer: Medicaid Other | Admitting: Obstetrics and Gynecology

## 2020-12-10 ENCOUNTER — Other Ambulatory Visit: Payer: Self-pay

## 2020-12-10 ENCOUNTER — Ambulatory Visit (HOSPITAL_COMMUNITY)
Admission: EM | Admit: 2020-12-10 | Discharge: 2020-12-10 | Disposition: A | Discharge status: Home / Self Care | Payer: Medicaid Other | Attending: Emergency Medicine | Admitting: Emergency Medicine

## 2020-12-10 ENCOUNTER — Encounter (HOSPITAL_COMMUNITY): Payer: Self-pay

## 2020-12-10 VITALS — BP 137/89 | HR 72 | Wt 156.0 lb

## 2020-12-10 DIAGNOSIS — N870 Mild cervical dysplasia: Secondary | ICD-10-CM

## 2020-12-10 DIAGNOSIS — J039 Acute tonsillitis, unspecified: Secondary | ICD-10-CM | POA: Insufficient documentation

## 2020-12-10 LAB — POCT RAPID STREP A, ED / UC: Streptococcus, Group A Screen (Direct): NEGATIVE

## 2020-12-10 MED ORDER — AMOXICILLIN 250 MG/5ML PO SUSR: 500.0000 mg | Freq: Two times a day (BID) | ORAL | 0 refills | Status: AC

## 2020-12-10 MED ORDER — METRONIDAZOLE 0.75 % VA GEL: 1.0000 | Freq: Two times a day (BID) | VAGINAL | 0 refills | Status: DC

## 2020-12-10 NOTE — ED Provider Notes (Signed)
[facility]    CSN: [id number] Arrival date & time: 12/10/20  1815      History   Chief Complaint Chief Complaint  Patient presents with   Sore Throat    HPI [name] is a 40 y.o. female.   Patient here for evaluation of sore throat that started last night.  Reports throat pain worse with talking and when swallowing but managing secretions at this time.  Denies any fevers at home.  Denies any recent sick contacts.  Has not taken any OTC medications.  Denies any trauma, injury, or other precipitating event.  Denies any specific alleviating or aggravating factors.  Denies any fevers, chest pain, shortness of breath, N/V/D, numbness, tingling, weakness, abdominal pain, or headaches.    The history is provided by the patient.  Sore Throat   Past Medical History:  Diagnosis Date   No pertinent past medical history     Patient Active Problem List   Diagnosis Date Noted   Dysplasia of cervix, low grade (CIN 1) 11/12/2020   Visit for routine gyn exam 07/13/2020   STD exposure 07/13/2020   Menorrhagia with regular cycle 07/13/2020   H/O tubal ligation 07/13/2020   H/O: cesarean section 07/13/2020    Past Surgical History:  Procedure Laterality Date   CESAREAN SECTION  2004   DILATION AND CURETTAGE OF UTERUS  2007   TUBAL LIGATION  2008    OB History     Gravida  4   Para  3   Term  0   Preterm      AB  1   Living  3      SAB  1   IAB      Ectopic      Multiple      Live Births               Home Medications    Prior to Admission medications   Medication Sig Start Date End Date Taking? Authorizing Provider  amoxicillin (AMOXIL) 250 MG/5ML suspension Take 10 mLs (500 mg total) by mouth 2 (two) times daily for 10 days. 12/10/20 12/20/20 Yes Ivette Loyal, NP  metroNIDAZOLE (METROGEL VAGINAL) 0.75 % vaginal gel Place 1 Applicatorful vaginally 2 (two) times daily. 12/10/20   Hermina Staggers, MD    Family History Family  History  Problem Relation Age of Onset   Hypertension Mother    Stroke Mother    Hyperlipidemia Mother    Seizures Mother    Kidney failure Father     Social History Social History   Tobacco Use   Smoking status: Former    Types: Cigars   Smokeless tobacco: Never  Substance Use Topics   Alcohol use: Yes   Drug use: No     Allergies   Shellfish allergy and Iodine   Review of Systems Review of Systems  HENT:  Positive for sore throat, trouble swallowing and voice change.   All other systems reviewed and are negative.   Physical Exam Triage Vital Signs ED Triage Vitals  Enc Vitals Group     BP 12/10/20 1909 (!) 154/91     Pulse Rate 12/10/20 1909 69     Resp 12/10/20 1909 17     Temp 12/10/20 1909 98.8 F (37.1 C)     Temp Source 12/10/20 1909 Oral     SpO2 12/10/20 1909 100 %     Weight --      Height --  Head Circumference --      Peak Flow --      Pain Score 12/10/20 1910 8     Pain Loc --      Pain Edu? --      Excl. in GC? --    No data found.  Updated Vital Signs BP (!) 154/91 (BP Location: Right Arm)   Pulse 69   Temp 98.8 F (37.1 C) (Oral)   Resp 17   LMP 11/30/2020   SpO2 100%   Visual Acuity Right Eye Distance:   Left Eye Distance:   Bilateral Distance:    Right Eye Near:   Left Eye Near:    Bilateral Near:     Physical Exam Vitals and nursing note reviewed.  Constitutional:      General: She is not in acute distress.    Appearance: Normal appearance. She is not ill-appearing, toxic-appearing or diaphoretic.  HENT:     Head: Normocephalic and atraumatic.     Mouth/Throat:     Pharynx: Uvula midline. Pharyngeal swelling, oropharyngeal exudate and posterior oropharyngeal erythema present.     Tonsils: Tonsillar exudate present. No tonsillar abscesses. 3+ on the right. 3+ on the left.  Eyes:     Conjunctiva/sclera: Conjunctivae normal.  Cardiovascular:     Rate and Rhythm: Normal rate.     Pulses: Normal pulses.   Pulmonary:     Effort: Pulmonary effort is normal.  Abdominal:     General: Abdomen is flat.  Musculoskeletal:        General: Normal range of motion.     Cervical back: Normal range of motion.  Skin:    General: Skin is warm and dry.  Neurological:     General: No focal deficit present.     Mental Status: She is alert and oriented to person, place, and time.  Psychiatric:        Mood and Affect: Mood normal.     UC Treatments / Results  Labs (all labs ordered are listed, but only abnormal results are displayed) Labs Reviewed  CULTURE, GROUP A STREP [facility])  POCT RAPID STREP A, ED / UC    EKG   Radiology No results found.  Procedures Procedures (including critical care time)  Medications Ordered in UC Medications - No data to display  Initial Impression / Assessment and Plan / UC Course  I have reviewed the triage vital signs and the nursing notes.  Pertinent labs & imaging results that were available during my care of the patient were reviewed by me and considered in my medical decision making (see chart for details).    Assessment negative for red flags or concerns.  Rapid strep negative, throat culture pending.  Based on modified [facility] criteria we will go ahead and treat for tonsillitis with amoxicillin twice daily for the next 10 days.  Patient did request liquids due to painful swallowing.  Tylenol and or ibuprofen as needed.  Discussed conservative symptom management as described in discharge instructions.  Encourage fluids and rest.  Strict ED follow-up for any worsening symptoms. Final Clinical Impressions(s) / UC Diagnoses   Final diagnoses:  Tonsillitis     Discharge Instructions      Take the amoxicillin twice a day for the next 10 days.  You can take Tylenol and/or ibuprofen as needed for pain relief and fever reduction.  For sore throat: try warm salt water gargles, cepacol lozenges, throat spray, warm tea or water with lemon/honey, popsicles  or ice  It is important to stay hydrated: drink plenty of fluids (water, gatorade/powerade/pedialyte, juices, or teas) to keep your throat moisturized and help further relieve irritation/discomfort.   Return or go to the Emergency Department if symptoms worsen or do not improve in the next few days.      ED Prescriptions     Medication Sig Dispense Auth. Provider   amoxicillin (AMOXIL) 250 MG/5ML suspension Take 10 mLs (500 mg total) by mouth 2 (two) times daily for 10 days. 200 mL Ivette Loyal, NP      PDMP not reviewed this encounter.   Ivette Loyal, NP 12/10/20 2032

## 2020-12-10 NOTE — Discharge Instructions (Signed)
Take the amoxicillin twice a day for the next 10 days.  You can take Tylenol and/or ibuprofen as needed for pain relief and fever reduction.  For sore throat: try warm salt water gargles, cepacol lozenges, throat spray, warm tea or water with lemon/honey, popsicles or ice   It is important to stay hydrated: drink plenty of fluids (water, gatorade/powerade/pedialyte, juices, or teas) to keep your throat moisturized and help further relieve irritation/discomfort.   Return or go to the Emergency Department if symptoms worsen or do not improve in the next few days.

## 2020-12-10 NOTE — Progress Notes (Signed)
[name] presents for follow up form her cryo of cervix. She reports some vaginal discharge. Has had a cycle since procedure Sexual active without problems, declines contraception  PE AF VSS  Chaperone present during exam  Lungs clear Heart RRR Abd soft + BS GU Nl EGBUS, cervix healing well, yellowish discharge  A/P S/P Cryo of cervix  Metrogel for discharge. Return to nl ADL's as tolerates. Repeat pap in 1 yr.

## 2020-12-10 NOTE — ED Triage Notes (Signed)
Pt presents with sore throat since last night.

## 2020-12-10 NOTE — Patient Instructions (Signed)
Health Maintenance, Female Adopting a healthy lifestyle and getting preventive care are important in promoting health and wellness. Ask your health care provider about: The right schedule for you to have regular tests and exams. Things you can do on your own to prevent diseases and keep yourself healthy. What should I know about diet, weight, and exercise? Eat a healthy diet  Eat a diet that includes plenty of vegetables, fruits, low-fat dairy products, and lean protein. Do not eat a lot of foods that are high in solid fats, added sugars, or sodium. Maintain a healthy weight Body mass index (BMI) is used to identify weight problems. It estimates body fat based on height and weight. Your health care provider can help determine your BMI and help you achieve or maintain a healthy weight. Get regular exercise Get regular exercise. This is one of the most important things you can do for your health. Most adults should: Exercise for at least 150 minutes each week. The exercise should increase your heart rate and make you sweat (moderate-intensity exercise). Do strengthening exercises at least twice a week. This is in addition to the moderate-intensity exercise. Spend less time sitting. Even light physical activity can be beneficial. Watch cholesterol and blood lipids Have your blood tested for lipids and cholesterol at 39 years of age, then have this test every 5 years. Have your cholesterol levels checked more often if: Your lipid or cholesterol levels are high. You are older than 40 years of age. You are at high risk for heart disease. What should I know about cancer screening? Depending on your health history and family history, you may need to have cancer screening at various ages. This may include screening for: Breast cancer. Cervical cancer. Colorectal cancer. Skin cancer. Lung cancer. What should I know about heart disease, diabetes, and high blood pressure? Blood pressure and heart  disease High blood pressure causes heart disease and increases the risk of stroke. This is more likely to develop in people who have high blood pressure readings, are of African descent, or are overweight. Have your blood pressure checked: Every 3-5 years if you are 18-39 years of age. Every year if you are 40 years old or older. Diabetes Have regular diabetes screenings. This checks your fasting blood sugar level. Have the screening done: Once every three years after age 40 if you are at a normal weight and have a low risk for diabetes. More often and at a younger age if you are overweight or have a high risk for diabetes. What should I know about preventing infection? Hepatitis B If you have a higher risk for hepatitis B, you should be screened for this virus. Talk with your health care provider to find out if you are at risk for hepatitis B infection. Hepatitis C Testing is recommended for: Everyone born from 1945 through 1965. Anyone with known risk factors for hepatitis C. Sexually transmitted infections (STIs) Get screened for STIs, including gonorrhea and chlamydia, if: You are sexually active and are younger than 39 years of age. You are older than 39 years of age and your health care provider tells you that you are at risk for this type of infection. Your sexual activity has changed since you were last screened, and you are at increased risk for chlamydia or gonorrhea. Ask your health care provider if you are at risk. Ask your health care provider about whether you are at high risk for HIV. Your health care provider may recommend a prescription medicine   to help prevent HIV infection. If you choose to take medicine to prevent HIV, you should first get tested for HIV. You should then be tested every 3 months for as long as you are taking the medicine. Pregnancy If you are about to stop having your period (premenopausal) and you may become pregnant, seek counseling before you get  pregnant. Take 400 to 800 micrograms (mcg) of folic acid every day if you become pregnant. Ask for birth control (contraception) if you want to prevent pregnancy. Osteoporosis and menopause Osteoporosis is a disease in which the bones lose minerals and strength with aging. This can result in bone fractures. If you are 65 years old or older, or if you are at risk for osteoporosis and fractures, ask your health care provider if you should: Be screened for bone loss. Take a calcium or vitamin D supplement to lower your risk of fractures. Be given hormone replacement therapy (HRT) to treat symptoms of menopause. Follow these instructions at home: Lifestyle Do not use any products that contain nicotine or tobacco, such as cigarettes, e-cigarettes, and chewing tobacco. If you need help quitting, ask your health care provider. Do not use street drugs. Do not share needles. Ask your health care provider for help if you need support or information about quitting drugs. Alcohol use Do not drink alcohol if: Your health care provider tells you not to drink. You are pregnant, may be pregnant, or are planning to become pregnant. If you drink alcohol: Limit how much you use to 0-1 drink a day. Limit intake if you are breastfeeding. Be aware of how much alcohol is in your drink. In the [place] one drink equals one 12 oz bottle of beer (355 mL), one 5 oz glass of wine (148 mL), or one 1 oz glass of hard liquor (44 mL). General instructions Schedule regular health, dental, and eye exams. Stay current with your vaccines. Tell your health care provider if: You often feel depressed. You have ever been abused or do not feel safe at home. Summary Adopting a healthy lifestyle and getting preventive care are important in promoting health and wellness. Follow your health care provider's instructions about healthy diet, exercising, and getting tested or screened for diseases. Follow your health care provider's  instructions on monitoring your cholesterol and blood pressure. This information is not intended to replace advice given to you by your health care provider. Make sure you discuss any questions you have with your health care provider. Document Revised: 05/11/2020 Document Reviewed: 02/24/2018 Elsevier Patient Education  2022 [employer]  

## 2020-12-13 LAB — CULTURE, GROUP A STREP (THRC)

## 2020-12-15 ENCOUNTER — Encounter: Payer: Self-pay | Admitting: Radiology

## 2021-06-06 ENCOUNTER — Ambulatory Visit (INDEPENDENT_AMBULATORY_CARE_PROVIDER_SITE_OTHER): Payer: Medicaid Other | Admitting: Internal Medicine

## 2021-06-06 ENCOUNTER — Other Ambulatory Visit: Payer: Self-pay

## 2021-06-06 ENCOUNTER — Encounter: Payer: Self-pay | Admitting: Internal Medicine

## 2021-06-06 VITALS — BP 148/94 | HR 70 | Temp 98.0°F | Resp 16 | Ht 59.0 in | Wt 153.0 lb

## 2021-06-06 DIAGNOSIS — B356 Tinea cruris: Secondary | ICD-10-CM | POA: Insufficient documentation

## 2021-06-06 DIAGNOSIS — I10 Essential (primary) hypertension: Secondary | ICD-10-CM | POA: Diagnosis not present

## 2021-06-06 DIAGNOSIS — R0609 Other forms of dyspnea: Secondary | ICD-10-CM

## 2021-06-06 LAB — URINALYSIS, ROUTINE W REFLEX MICROSCOPIC
Bilirubin Urine: NEGATIVE
Ketones, ur: NEGATIVE
Leukocytes,Ua: NEGATIVE
Nitrite: NEGATIVE
Specific Gravity, Urine: 1.025 (ref 1.000–1.030)
Urine Glucose: NEGATIVE
Urobilinogen, UA: 0.2 (ref 0.0–1.0)
pH: 6 (ref 5.0–8.0)

## 2021-06-06 LAB — HEPATIC FUNCTION PANEL
ALT: 17 U/L (ref 0–35)
AST: 20 U/L (ref 0–37)
Albumin: 4.6 g/dL (ref 3.5–5.2)
Alkaline Phosphatase: 68 U/L (ref 39–117)
Bilirubin, Direct: 0.1 mg/dL (ref 0.0–0.3)
Total Bilirubin: 0.3 mg/dL (ref 0.2–1.2)
Total Protein: 7.5 g/dL (ref 6.0–8.3)

## 2021-06-06 LAB — BASIC METABOLIC PANEL
BUN: 11 mg/dL (ref 6–23)
CO2: 26 mEq/L (ref 19–32)
Calcium: 9.1 mg/dL (ref 8.4–10.5)
Chloride: 107 mEq/L (ref 96–112)
Creatinine, Ser: 0.63 mg/dL (ref 0.40–1.20)
GFR: 111.34 mL/min (ref >60.00)
Glucose, Bld: 87 mg/dL (ref 70–99)
Potassium: 3.7 mEq/L (ref 3.5–5.1)
Sodium: 139 mEq/L (ref 135–145)

## 2021-06-06 LAB — CBC WITH DIFFERENTIAL/PLATELET
Basophils Absolute: 0.1 10*3/uL (ref 0.0–0.1)
Basophils Relative: 1 % (ref 0.0–3.0)
Eosinophils Absolute: 0.2 10*3/uL (ref 0.0–0.7)
Eosinophils Relative: 3.1 % (ref 0.0–5.0)
HCT: 38.2 % (ref 36.0–46.0)
Hemoglobin: 12.5 g/dL (ref 12.0–15.0)
Lymphocytes Relative: 39.7 % (ref 12.0–46.0)
Lymphs Abs: 2.3 10*3/uL (ref 0.7–4.0)
MCHC: 32.8 g/dL (ref 30.0–36.0)
MCV: 95.4 fl (ref 78.0–100.0)
Monocytes Absolute: 0.5 10*3/uL (ref 0.1–1.0)
Monocytes Relative: 8.8 % (ref 3.0–12.0)
Neutro Abs: 2.8 10*3/uL (ref 1.4–7.7)
Neutrophils Relative %: 47.4 % (ref 43.0–77.0)
Platelets: 189 10*3/uL (ref 150.0–400.0)
RBC: 4 Mil/uL (ref 3.87–5.11)
RDW: 13.7 % (ref 11.5–15.5)
WBC: 5.9 10*3/uL (ref 4.0–10.5)

## 2021-06-06 LAB — TROPONIN I (HIGH SENSITIVITY): High Sens Troponin I: 4 ng/L (ref 2–17)

## 2021-06-06 LAB — BRAIN NATRIURETIC PEPTIDE: Pro B Natriuretic peptide (BNP): 22 pg/mL (ref 0.0–100.0)

## 2021-06-06 LAB — TSH: TSH: 1.12 u[IU]/mL (ref 0.35–5.50)

## 2021-06-06 MED ORDER — FLUCONAZOLE 150 MG PO TABS: 150.0000 mg | ORAL_TABLET | ORAL | 0 refills | Status: AC

## 2021-06-06 NOTE — Patient Instructions (Signed)

## 2021-06-06 NOTE — Progress Notes (Addendum)
? ?Subjective:  ?Patient ID: [name], female    DOB: 14-Oct-1980  Age: 41 y.o. MRN: [id number] ? ?CC: Hypertension ? ?This visit occurred during the SARS-CoV-2 public health emergency.  Safety protocols were in place, including screening questions prior to the visit, additional usage of staff PPE, and extensive cleaning of exam room while observing appropriate contact time as indicated for disinfecting solutions.   ? ?HPI ?[name] presents for establishing. ? ?She complains of a 62-month history of intermittent headaches, dizziness, and dyspnea on exertion.  Her headaches are usually migraines and occur about once or twice a month but for the last month she has had different headaches.  The headaches get better after she sleeps.  She has a history of hypertension but is not taking any antihypertensives.  She denies chest pain, diaphoresis, edema, or palpitations. ? ?History ?[name] has a past medical history of No pertinent past medical history.  ? ?She has a past surgical history that includes Dilation and curettage of uterus (2007); Tubal ligation (2008); and Cesarean section (2004).  ? ?Her family history includes Cirrhosis in her father; Hyperlipidemia in her mother; Hypertension in her mother; Kidney failure in her father; Seizures in her mother; Stroke in her mother.She reports that she has quit smoking. Her smoking use included cigars. She has never been exposed to tobacco smoke. She has never used smokeless tobacco. She reports current alcohol use of about 2.0 standard drinks per week. She reports that she does not use drugs. ? ?Outpatient Medications Prior to Visit  ?Medication Sig Dispense Refill  ? metroNIDAZOLE (METROGEL VAGINAL) 0.75 % vaginal gel Place 1 Applicatorful vaginally 2 (two) times daily. 70 g 0  ? ?No facility-administered medications prior to visit.  ? ? ?ROS ?Review of Systems  ?Constitutional: Negative.  Negative for fatigue and unexpected weight change.  ?HENT:  Negative.    ?Eyes: Negative.   ?Respiratory:  Positive for shortness of breath. Negative for cough, chest tightness and wheezing.   ?Cardiovascular:  Negative for chest pain, palpitations and leg swelling.  ?Gastrointestinal:  Negative for abdominal pain, constipation, diarrhea, nausea and vomiting.  ?Endocrine: Negative.   ?Genitourinary: Negative.  Negative for difficulty urinating.  ?Musculoskeletal: Negative.  Negative for back pain and neck pain.  ?Skin:  Positive for color change and rash.  ?     Itchy groin rash for months  ?Neurological:  Negative for dizziness, weakness, light-headedness and headaches.  ?Hematological:  Negative for adenopathy. Does not bruise/bleed easily.  ?Psychiatric/Behavioral: Negative.    ? ?Objective:  ?BP (!) 148/94 (BP Location: Right Arm, Patient Position: Sitting, Cuff Size: Large)   Pulse 70   Temp 98 ?F (36.7 ?C) (Oral)   Resp 16   Ht 4\' 11"  (1.499 m)   Wt 153 lb (69.4 kg)   LMP 06/05/2021 (Exact Date) Comment: BTL  SpO2 98%   BMI 30.90 kg/m?  ? ?Physical Exam ?Vitals reviewed. Exam conducted with a chaperone present 06/07/2021).  ?Constitutional:   ?   Appearance: She is not ill-appearing.  ?HENT:  ?   Nose: Nose normal.  ?   Mouth/Throat:  ?   Mouth: Mucous membranes are moist.  ?Eyes:  ?   General: No scleral icterus. ?   Conjunctiva/sclera: Conjunctivae normal.  ?Cardiovascular:  ?   Rate and Rhythm: Normal rate and regular rhythm.  ?   Heart sounds: No murmur heard. ?   Comments: EKG- ?NSR, 67 bpm ?No LVH  ?Normal EKG ?Pulmonary:  ?  Effort: Pulmonary effort is normal.  ?   Breath sounds: No stridor. No wheezing, rhonchi or rales.  ?Abdominal:  ?   General: Abdomen is flat.  ?   Palpations: There is no mass.  ?   Tenderness: There is no abdominal tenderness. There is no guarding.  ?   Hernia: No hernia is present.  ?Genitourinary: ? ? ?   Comments: Coalesced hyperpigmented papules ?Musculoskeletal:  ?   Cervical back: Neck supple.  ?   Right lower leg: No  edema.  ?   Left lower leg: No edema.  ?Lymphadenopathy:  ?   Cervical: No cervical adenopathy.  ?Skin: ?   General: Skin is warm and dry.  ?   Findings: Rash present.  ?Neurological:  ?   General: No focal deficit present.  ?   Mental Status: She is alert. Mental status is at baseline.  ?Psychiatric:     ?   Mood and Affect: Mood normal.     ?   Behavior: Behavior normal.  ? ? ?Lab Results  ?Component Value Date  ? WBC 5.9 06/06/2021  ? HGB 12.5 06/06/2021  ? HCT 38.2 06/06/2021  ? PLT 189.0 06/06/2021  ? GLUCOSE 87 06/06/2021  ? ALT 17 06/06/2021  ? AST 20 06/06/2021  ? NA 139 06/06/2021  ? K 3.7 06/06/2021  ? CL 107 06/06/2021  ? CREATININE 0.63 06/06/2021  ? BUN 11 06/06/2021  ? CO2 26 06/06/2021  ? TSH 1.12 06/06/2021  ?  ? ? ?Assessment & Plan:  ? ?[name] was seen today for hypertension. ? ?Diagnoses and all orders for this visit: ? ?DOE (dyspnea on exertion)- Her labs and EKG are reassuring.  I think she is symptomatic from obesity and poor conditioning.  In addition to lifestyle modifications I recommended that she start taking a calcium channel blocker. ?-     Troponin I (High Sensitivity); Future ?-     Brain natriuretic peptide; Future ?-     EKG 12-Lead ?-     Brain natriuretic peptide ?-     Troponin I (High Sensitivity) ? ?Primary hypertension- Will check labs to screen for secondary causes and endorgan damage. ?-     Basic metabolic panel; Future ?-     CBC with Differential/Platelet; Future ?-     TSH; Future ?-     Urinalysis, Routine w reflex microscopic; Future ?-     Hepatic function panel; Future ?-     Aldosterone + renin activity w/ ratio; Future ?-     EKG 12-Lead ?-     Aldosterone + renin activity w/ ratio ?-     Hepatic function panel ?-     Urinalysis, Routine w reflex microscopic ?-     TSH ?-     CBC with Differential/Platelet ?-     Basic metabolic panel ?-     amLODipine (NORVASC) 5 MG tablet; Take 1 tablet (5 mg total) by mouth daily. ? ?Tinea cruris ?-     fluconazole (DIFLUCAN)  150 MG tablet; Take 1 tablet (150 mg total) by mouth once a week for 4 doses. ? ? ?I have discontinued Imanie Y. Kaneko's metroNIDAZOLE. I am also having her start on fluconazole and amLODipine. ? ?Meds ordered this encounter  ?Medications  ? fluconazole (DIFLUCAN) 150 MG tablet  ?  Sig: Take 1 tablet (150 mg total) by mouth once a week for 4 doses.  ?  Dispense:  4 tablet  ?  Refill:  0  ?  amLODipine (NORVASC) 5 MG tablet  ?  Sig: Take 1 tablet (5 mg total) by mouth daily.  ?  Dispense:  90 tablet  ?  Refill:  0  ? ? ? ?Follow-up: Return in about 6 weeks (around 07/18/2021). ? ?Sanda Linger, MD ?

## 2021-06-07 MED ORDER — AMLODIPINE BESYLATE 5 MG PO TABS: 5.0000 mg | ORAL_TABLET | Freq: Every day | ORAL | 0 refills | Status: DC

## 2021-06-08 ENCOUNTER — Encounter: Payer: Self-pay | Admitting: Internal Medicine

## 2021-06-14 ENCOUNTER — Encounter: Payer: Self-pay | Admitting: Internal Medicine

## 2021-06-14 LAB — ALDOSTERONE + RENIN ACTIVITY W/ RATIO
ALDO / PRA Ratio: 157.1 Ratio — ABNORMAL HIGH (ref 0.9–28.9)
Aldosterone: 11 ng/dL
Renin Activity: 0.07 ng/mL/h — ABNORMAL LOW (ref 0.25–5.82)

## 2021-07-22 ENCOUNTER — Ambulatory Visit: Payer: Medicaid Other | Admitting: Internal Medicine

## 2021-07-22 ENCOUNTER — Encounter: Payer: Self-pay | Admitting: Internal Medicine

## 2021-07-22 VITALS — BP 138/88 | HR 88 | Temp 99.0°F | Resp 16 | Ht 59.0 in | Wt 151.0 lb

## 2021-07-22 DIAGNOSIS — G43009 Migraine without aura, not intractable, without status migrainosus: Secondary | ICD-10-CM | POA: Diagnosis not present

## 2021-07-22 DIAGNOSIS — I10 Essential (primary) hypertension: Secondary | ICD-10-CM

## 2021-07-22 DIAGNOSIS — L509 Urticaria, unspecified: Secondary | ICD-10-CM

## 2021-07-22 MED ORDER — LEVOCETIRIZINE DIHYDROCHLORIDE 5 MG PO TABS: 5.0000 mg | ORAL_TABLET | Freq: Every evening | ORAL | 1 refills | Status: DC

## 2021-07-22 MED ORDER — AMLODIPINE BESYLATE 10 MG PO TABS: 10.0000 mg | ORAL_TABLET | Freq: Every day | ORAL | 1 refills | Status: DC

## 2021-07-22 MED ORDER — UBRELVY 100 MG PO TABS: 1.0000 | ORAL_TABLET | Freq: Every day | ORAL | 0 refills | Status: DC | PRN

## 2021-07-22 NOTE — Progress Notes (Signed)
? ?Subjective:  ?Patient ID: [name], female    DOB: April 27, 1980  Age: 41 y.o. MRN: [id number] ? ?CC: Hypertension and Headache ? ? ?CVE:LFYBOFBP [name] presents for f/up - ? ?She has had headache for many years.  They occur about 2-3 times a month.  She takes ibuprofen but it causes hives, rash, and itching on her upper extremities.  Her headaches have actually decreased since she started taking amlodipine.  She describes the headache pain as tightness in her temples with photophobia and phonophobia.  She will have nausea and blurred vision with the headaches but never vomiting.  She denies paresthesias. ? ?Outpatient Medications Prior to Visit  ?Medication Sig Dispense Refill  ? amLODipine (NORVASC) 5 MG tablet Take 1 tablet (5 mg total) by mouth daily. 90 tablet 0  ? ?No facility-administered medications prior to visit.  ? ? ?ROS ?Review of Systems  ?Constitutional:  Negative for chills, diaphoresis, fatigue and fever.  ?HENT: Negative.    ?Eyes:  Positive for photophobia. Negative for visual disturbance.  ?Respiratory:  Negative for cough, chest tightness, shortness of breath and wheezing.   ?Cardiovascular:  Negative for chest pain, palpitations and leg swelling.  ?Gastrointestinal:  Positive for nausea. Negative for abdominal pain, constipation, diarrhea and vomiting.  ?Endocrine: Negative.   ?Genitourinary: Negative.   ?Musculoskeletal: Negative.  Negative for arthralgias and myalgias.  ?Skin: Negative.  Negative for color change and rash.  ?Neurological:  Positive for headaches. Negative for dizziness, seizures, speech difficulty, weakness, light-headedness and numbness.  ?Hematological:  Negative for adenopathy. Does not bruise/bleed easily.  ? ?Objective:  ?BP 138/88 (BP Location: Right Arm, Patient Position: Sitting, Cuff Size: Large)   Pulse 88   Temp 99 ?F (37.2 ?C) (Oral)   Resp 16   Ht 4\' 11"  (1.499 m)   Wt 151 lb (68.5 kg)   LMP 06/28/2021 (Exact Date) Comment: BTL  SpO2 99%    BMI 30.50 kg/m?  ? ?BP Readings from Last 3 Encounters:  ?07/22/21 138/88  ?06/06/21 (!) 148/94  ?12/10/20 (!) 154/91  ? ? ?Wt Readings from Last 3 Encounters:  ?07/22/21 151 lb (68.5 kg)  ?06/06/21 153 lb (69.4 kg)  ?12/10/20 156 lb (70.8 kg)  ? ? ?Physical Exam ?Vitals reviewed.  ?Constitutional:   ?   Appearance: She is well-developed. She is not ill-appearing.  ?HENT:  ?   Nose: Nose normal.  ?   Mouth/Throat:  ?   Mouth: Mucous membranes are moist.  ?Eyes:  ?   General: No scleral icterus. ?   Extraocular Movements: Extraocular movements intact.  ?   Right eye: No nystagmus.  ?   Left eye: No nystagmus.  ?   Pupils: Pupils are equal, round, and reactive to light.  ?Cardiovascular:  ?   Rate and Rhythm: Normal rate and regular rhythm.  ?   Heart sounds: No murmur heard. ?Pulmonary:  ?   Effort: Pulmonary effort is normal.  ?   Breath sounds: No stridor. No wheezing, rhonchi or rales.  ?Abdominal:  ?   General: Abdomen is flat.  ?   Palpations: There is no mass.  ?   Tenderness: There is no abdominal tenderness. There is no guarding.  ?   Hernia: No hernia is present.  ?Musculoskeletal:     ?   General: Normal range of motion.  ?   Cervical back: Normal range of motion and neck supple.  ?   Right lower leg: No edema.  ?  Left lower leg: No edema.  ?Skin: ?   General: Skin is warm and dry.  ?Neurological:  ?   General: No focal deficit present.  ?   Mental Status: She is alert and oriented to person, place, and time. Mental status is at baseline.  ?   Cranial Nerves: No cranial nerve deficit.  ?   Sensory: No sensory deficit.  ?   Motor: No weakness.  ?   Gait: Gait normal.  ?   Deep Tendon Reflexes: Reflexes normal.  ?Psychiatric:     ?   Mood and Affect: Mood normal.     ?   Behavior: Behavior normal.  ? ? ?Lab Results  ?Component Value Date  ? WBC 5.9 06/06/2021  ? HGB 12.5 06/06/2021  ? HCT 38.2 06/06/2021  ? PLT 189.0 06/06/2021  ? GLUCOSE 87 06/06/2021  ? ALT 17 06/06/2021  ? AST 20 06/06/2021  ? NA 139  06/06/2021  ? K 3.7 06/06/2021  ? CL 107 06/06/2021  ? CREATININE 0.63 06/06/2021  ? BUN 11 06/06/2021  ? CO2 26 06/06/2021  ? TSH 1.12 06/06/2021  ? ? ?No results found. ? ?Assessment & Plan:  ? ?[name] was seen today for hypertension and headache. ? ?Diagnoses and all orders for this visit: ? ?Migraine without aura and without status migrainosus, not intractable ?-     Ubrogepant (UBRELVY) 100 MG TABS; Take 1 tablet by mouth daily as needed. ? ?Primary hypertension- She has not achieved her blood pressure goal of 130/80.  Will double the dose of amlodipine. ?-     amLODipine (NORVASC) 10 MG tablet; Take 1 tablet (10 mg total) by mouth daily. ? ?Urticaria- She will stop taking ibuprofen. ?-     levocetirizine (XYZAL) 5 MG tablet; Take 1 tablet (5 mg total) by mouth every evening. ? ? ?I have discontinued Thamar Y. Dellarocco's amLODipine. I am also having her start on Ubrelvy, levocetirizine, and amLODipine. ? ?Meds ordered this encounter  ?Medications  ? Ubrogepant (UBRELVY) 100 MG TABS  ?  Sig: Take 1 tablet by mouth daily as needed.  ?  Dispense:  1 tablet  ?  Refill:  0  ? levocetirizine (XYZAL) 5 MG tablet  ?  Sig: Take 1 tablet (5 mg total) by mouth every evening.  ?  Dispense:  90 tablet  ?  Refill:  1  ? amLODipine (NORVASC) 10 MG tablet  ?  Sig: Take 1 tablet (10 mg total) by mouth daily.  ?  Dispense:  90 tablet  ?  Refill:  1  ? ? ? ?Follow-up: Return in about 6 months (around 01/22/2022). ? ?Scarlette Calico, MD ?

## 2021-07-22 NOTE — Patient Instructions (Signed)
Migraine Headache A migraine headache is an intense, throbbing pain on one side or both sides of the head. Migraine headaches may also cause other symptoms, such as nausea, vomiting, and sensitivity to light and noise. A migraine headache can last from 4 hours to 3 days. Talk with your doctor about what things may bring on (trigger) your migraine headaches. What are the causes? The exact cause of this condition is not known. However, a migraine may be caused when nerves in the brain become irritated and release chemicals that cause inflammation of blood vessels. This inflammation causes pain. This condition may be triggered or caused by: Drinking alcohol. Smoking. Taking medicines, such as: Medicine used to treat chest pain (nitroglycerin). Birth control pills. Estrogen. Certain blood pressure medicines. Eating or drinking products that contain nitrates, glutamate, aspartame, or tyramine. Aged cheeses, chocolate, or caffeine may also be triggers. Doing physical activity. Other things that may trigger a migraine headache include: Menstruation. Pregnancy. Hunger. Stress. Lack of sleep or too much sleep. Weather changes. Fatigue. What increases the risk? The following factors may make you more likely to experience migraine headaches: Being a certain age. This condition is more common in people who are 25-55 years old. Being female. Having a family history of migraine headaches. Being Caucasian. Having a mental health condition, such as depression or anxiety. Being obese. What are the signs or symptoms? The main symptom of this condition is pulsating or throbbing pain. This pain may: Happen in any area of the head, such as on one side or both sides. Interfere with daily activities. Get worse with physical activity. Get worse with exposure to bright lights or loud noises. Other symptoms may include: Nausea. Vomiting. Dizziness. General sensitivity to bright lights, loud noises, or  smells. Before you get a migraine headache, you may get warning signs (an aura). An aura may include: Seeing flashing lights or having blind spots. Seeing bright spots, halos, or zigzag lines. Having tunnel vision or blurred vision. Having numbness or a tingling feeling. Having trouble talking. Having muscle weakness. Some people have symptoms after a migraine headache (postdromal phase), such as: Feeling tired. Difficulty concentrating. How is this diagnosed? A migraine headache can be diagnosed based on: Your symptoms. A physical exam. Tests, such as: CT scan or an MRI of the head. These imaging tests can help rule out other causes of headaches. Taking fluid from the spine (lumbar puncture) and analyzing it (cerebrospinal fluid analysis, or CSF analysis). How is this treated? This condition may be treated with medicines that: Relieve pain. Relieve nausea. Prevent migraine headaches. Treatment for this condition may also include: Acupuncture. Lifestyle changes like avoiding foods that trigger migraine headaches. Biofeedback. Cognitive behavioral therapy. Follow these instructions at home: Medicines Take over-the-counter and prescription medicines only as told by your health care provider. Ask your health care provider if the medicine prescribed to you: Requires you to avoid driving or using heavy machinery. Can cause constipation. You may need to take these actions to prevent or treat constipation: Drink enough fluid to keep your urine pale yellow. Take over-the-counter or prescription medicines. Eat foods that are high in fiber, such as beans, whole grains, and fresh fruits and vegetables. Limit foods that are high in fat and processed sugars, such as fried or sweet foods. Lifestyle Do not drink alcohol. Do not use any products that contain nicotine or tobacco, such as cigarettes, e-cigarettes, and chewing tobacco. If you need help quitting, ask your health care  provider. Get at least 8   hours of sleep every night. Find ways to manage stress, such as meditation, deep breathing, or yoga. General instructions     Keep a journal to find out what may trigger your migraine headaches. For example, write down: What you eat and drink. How much sleep you get. Any change to your diet or medicines. If you have a migraine headache: Avoid things that make your symptoms worse, such as bright lights. It may help to lie down in a dark, quiet room. Do not drive or use heavy machinery. Ask your health care provider what activities are safe for you while you are experiencing symptoms. Keep all follow-up visits as told by your health care provider. This is important. Contact a health care provider if: You develop symptoms that are different or more severe than your usual migraine headache symptoms. You have more than 15 headache days in one month. Get help right away if: Your migraine headache becomes severe. Your migraine headache lasts longer than 72 hours. You have a fever. You have a stiff neck. You have vision loss. Your muscles feel weak or like you cannot control them. You start to lose your balance often. You have trouble walking. You faint. You have a seizure. Summary A migraine headache is an intense, throbbing pain on one side or both sides of the head. Migraines may also cause other symptoms, such as nausea, vomiting, and sensitivity to light and noise. This condition may be treated with medicines and lifestyle changes. You may also need to avoid certain things that trigger a migraine headache. Keep a journal to find out what may trigger your migraine headaches. Contact your health care provider if you have more than 15 headache days in a month or you develop symptoms that are different or more severe than your usual migraine headache symptoms. This information is not intended to replace advice given to you by your health care provider. Make sure  you discuss any questions you have with your health care provider. Document Revised: 06/25/2018 Document Reviewed: 04/15/2018 Elsevier Patient Education  2023 [employer]  

## 2021-07-29 ENCOUNTER — Other Ambulatory Visit: Payer: Self-pay | Admitting: Internal Medicine

## 2021-07-29 ENCOUNTER — Encounter: Payer: Self-pay | Admitting: Internal Medicine

## 2021-07-29 DIAGNOSIS — B356 Tinea cruris: Secondary | ICD-10-CM

## 2021-07-29 MED ORDER — TRIAMCINOLONE ACETONIDE 0.5 % EX CREA: 1.0000 "application " | TOPICAL_CREAM | Freq: Three times a day (TID) | CUTANEOUS | 1 refills | Status: DC

## 2021-09-05 ENCOUNTER — Encounter: Payer: Self-pay | Admitting: Internal Medicine

## 2021-11-10 IMAGING — US US PELVIS COMPLETE TRANSABD/TRANSVAG W DUPLEX
1 series · 13 of 25 positions shown · non-contrast
Comparison: CT abdomen pelvis 06/03/2020

CLINICAL DATA: Right lower quadrant pain

EXAM:
TRANSABDOMINAL AND TRANSVAGINAL ULTRASOUND OF PELVIS
DOPPLER ULTRASOUND OF OVARIES
TECHNIQUE: Both transabdominal and transvaginal ultrasound examinations of the
pelvis were performed. Transabdominal technique was performed for
global imaging of the pelvis including uterus, ovaries, adnexal
regions, and pelvic cul-de-sac.
It was necessary to proceed with endovaginal exam following the
transabdominal exam to visualize the endometrium and ovaries. Color
and duplex Doppler ultrasound was utilized to evaluate blood flow to
the ovaries.

[Series 1: us pelvic complete w transvaginal and torsion righ · 13 of 141 slices shown]
[im 1/141]
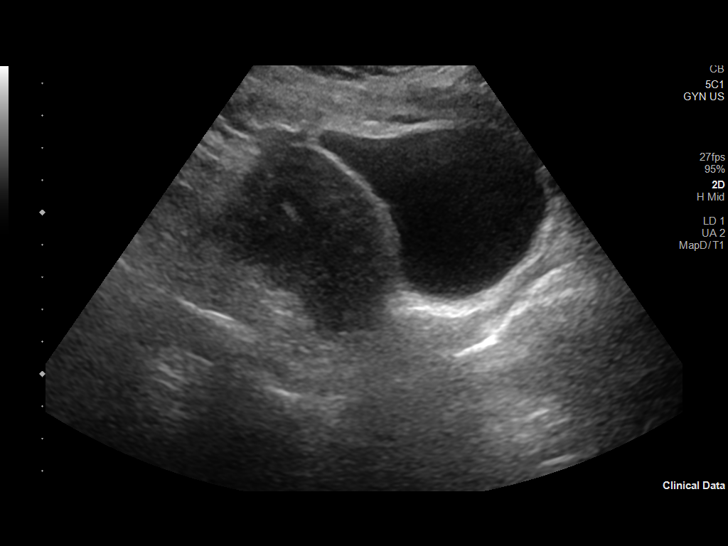
[im 12/141]
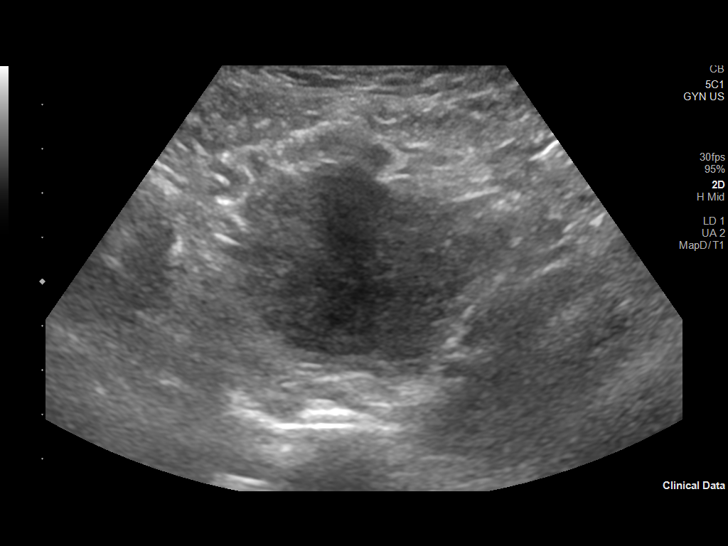
[im 24/141]
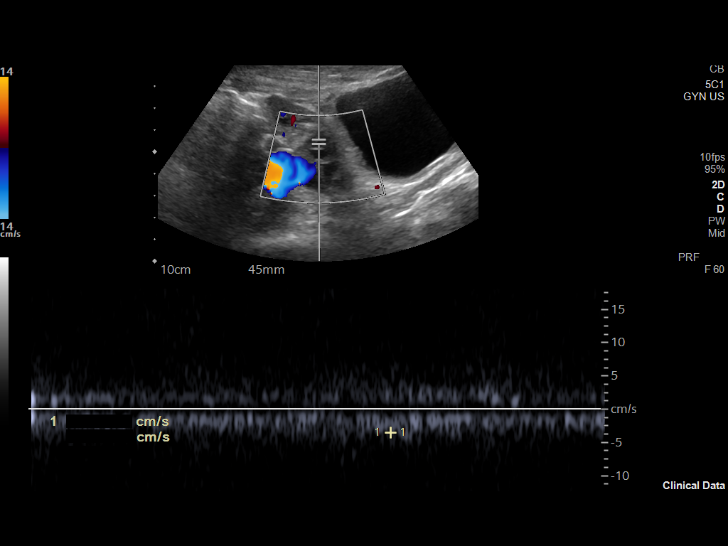
[im 36/141]
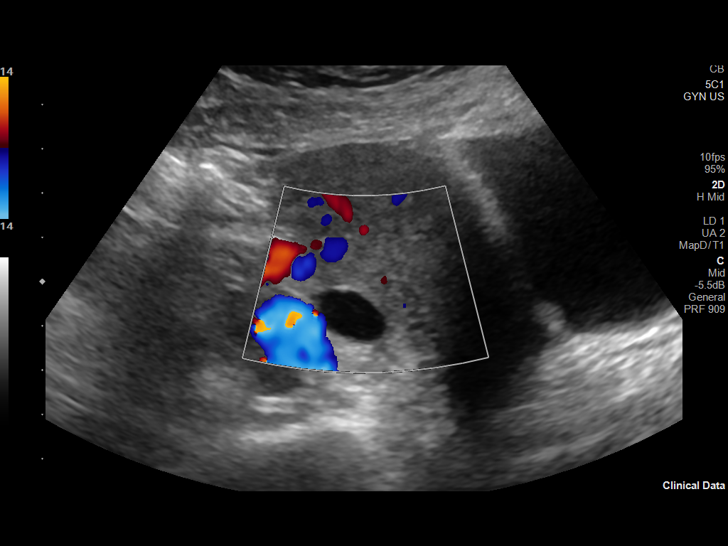
[im 47/141]
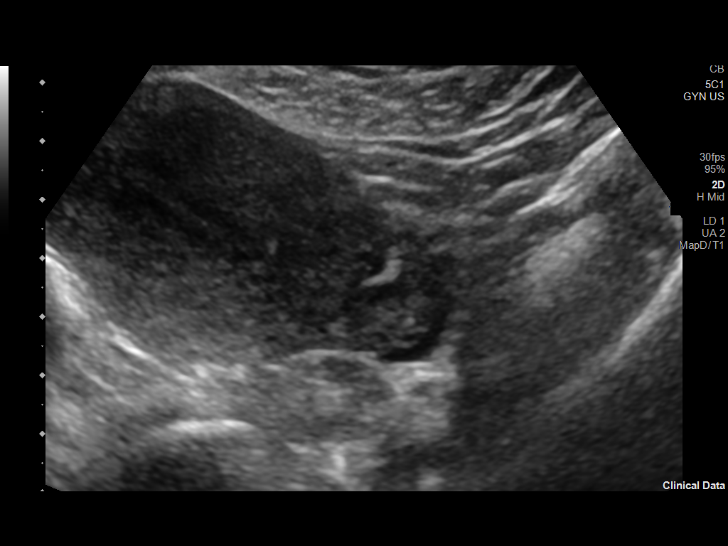
[im 59/141]
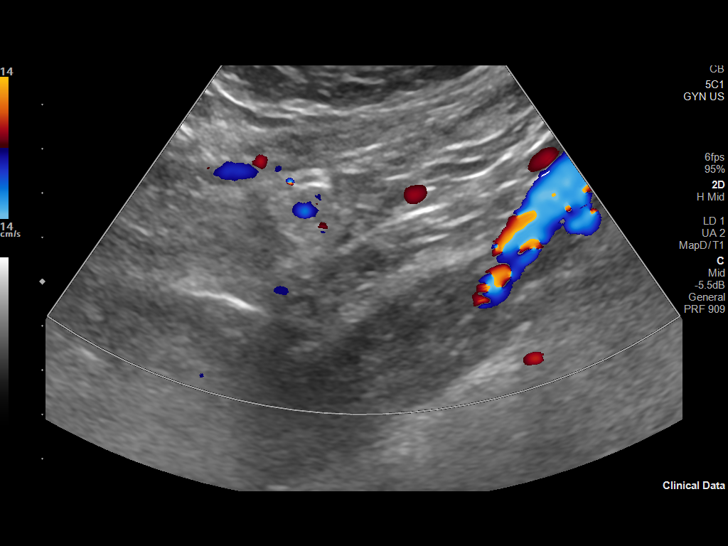
[im 71/141]
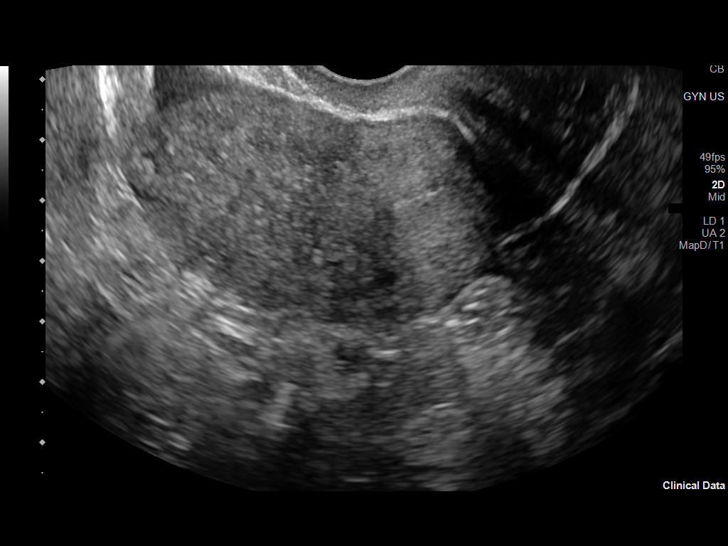
[im 82/141]
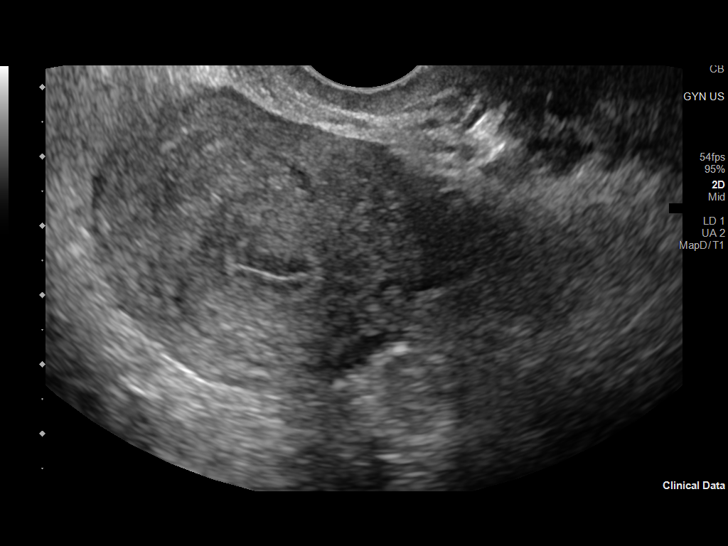
[im 94/141]
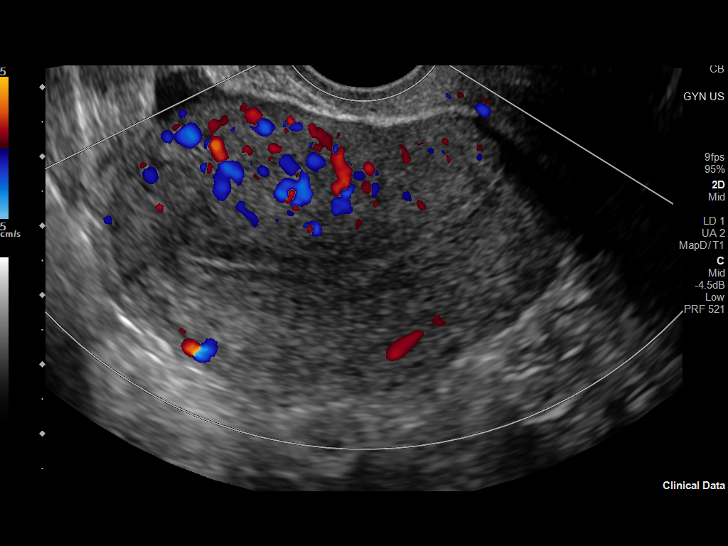
[im 106/141]
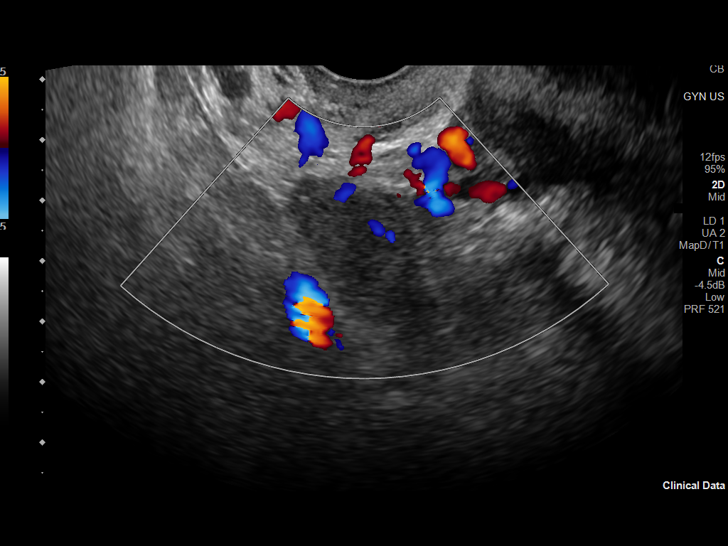
[im 117/141]
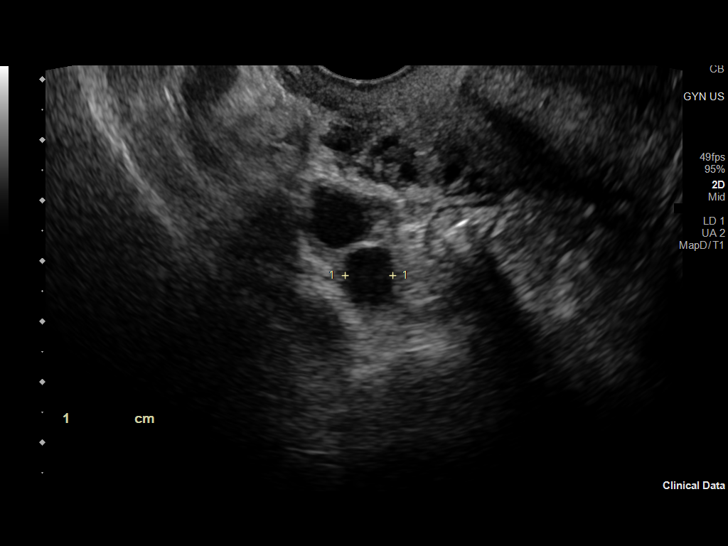
[im 129/141]
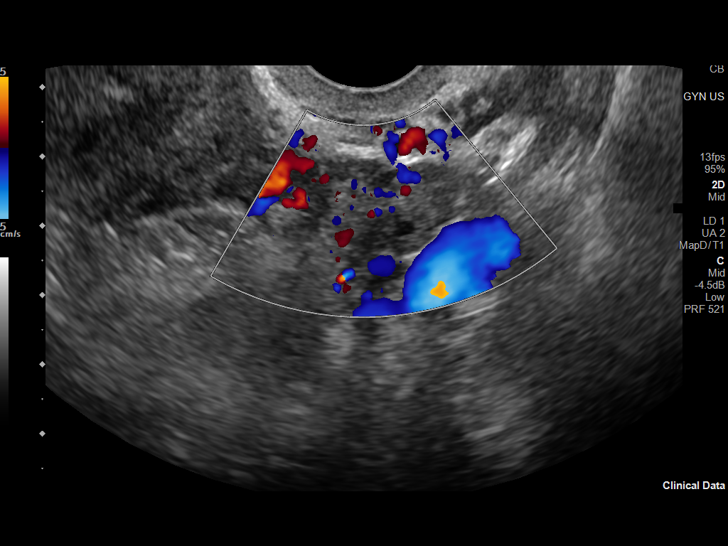
[im 141/141]
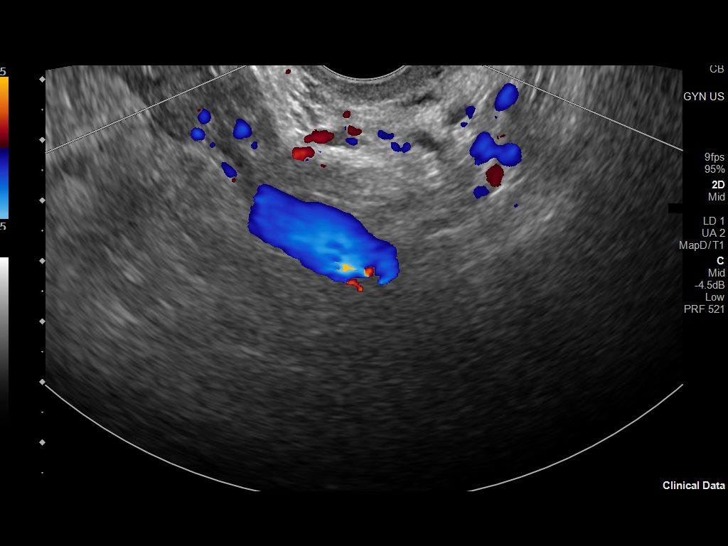

[13 of 25 positions shown; findings below may reference images not displayed]

FINDINGS: Uterus

Measurements: 10 x 4.4 x 5.8 cm = volume: 134.4 mL. Minimal
nonspecific heterogeneity of the myometrium without concerning
features. No discrete uterine mass or fibroid.

Endometrium

Thickness: 7.7 mm, non thickened with a trilaminar appearance
suggestive of a late proliferative phase. No focal abnormality
visualized.

Right ovary

Measurements: 3.6 x 2.1 x 1.8 cm = volume: 7.1 mL. 2.1 x 1.2 x
cm anechoic follicle within the right adnexa. A smaller 1.4 x 1.5 x
0.8 cm anechoic cyst is seen adjacent the ovary, possibly a small
paraovarian cyst.

Left ovary

Measurements: 2.9 x 1.3 x 1.9 cm = volume: 3.9 ML. Normal appearance
with normal follicles.

Pulsed Doppler evaluation of both ovaries demonstrates normal
low-resistance arterial and venous waveforms.

Other findings

Small volume of anechoic free fluid anterior to the uterus and
within the adnexa, nonspecific.
IMPRESSION: 1. No convincing acute pelvic abnormality is seen.
2. Small volume of anechoic free fluid anterior to the uterus and
within the adnexa, nonspecific and possibly physiologic in a
reproductive age female.
3. Dominant follicle in the right ovary with additional likely small
paraovarian cyst. No routine followup imaging recommended
symptoms persist, follow-up ultrasound in 6-8 weeks could be
considered.
4. Unremarkable Doppler evaluation of the ovaries without evidence
of torsion.

## 2021-12-09 ENCOUNTER — Ambulatory Visit: Payer: Medicaid Other | Admitting: Internal Medicine

## 2022-01-20 ENCOUNTER — Ambulatory Visit: Payer: Medicaid Other | Admitting: Internal Medicine

## 2022-03-01 ENCOUNTER — Other Ambulatory Visit: Payer: Self-pay | Admitting: Internal Medicine

## 2022-03-01 DIAGNOSIS — B356 Tinea cruris: Secondary | ICD-10-CM

## 2022-04-15 ENCOUNTER — Ambulatory Visit: Payer: Medicaid Other | Admitting: Internal Medicine

## 2022-06-04 ENCOUNTER — Encounter: Payer: Self-pay | Admitting: Internal Medicine

## 2022-07-07 ENCOUNTER — Ambulatory Visit: Payer: Medicaid Other | Admitting: Internal Medicine

## 2022-07-07 ENCOUNTER — Encounter: Payer: Self-pay | Admitting: Internal Medicine

## 2022-07-07 VITALS — BP 136/96 | HR 84 | Temp 98.3°F | Resp 16 | Ht 59.0 in | Wt 150.0 lb

## 2022-07-07 DIAGNOSIS — B356 Tinea cruris: Secondary | ICD-10-CM | POA: Diagnosis not present

## 2022-07-07 DIAGNOSIS — L509 Urticaria, unspecified: Secondary | ICD-10-CM | POA: Diagnosis not present

## 2022-07-07 DIAGNOSIS — I10 Essential (primary) hypertension: Secondary | ICD-10-CM | POA: Diagnosis not present

## 2022-07-07 DIAGNOSIS — L720 Epidermal cyst: Secondary | ICD-10-CM | POA: Insufficient documentation

## 2022-07-07 DIAGNOSIS — Z1231 Encounter for screening mammogram for malignant neoplasm of breast: Secondary | ICD-10-CM

## 2022-07-07 LAB — CBC WITH DIFFERENTIAL/PLATELET
Basophils Absolute: 0.1 10*3/uL (ref 0.0–0.1)
Basophils Relative: 0.7 % (ref 0.0–3.0)
Eosinophils Absolute: 0.3 10*3/uL (ref 0.0–0.7)
Eosinophils Relative: 3.3 % (ref 0.0–5.0)
HCT: 37.3 % (ref 36.0–46.0)
Hemoglobin: 12.4 g/dL (ref 12.0–15.0)
Lymphocytes Relative: 37.2 % (ref 12.0–46.0)
Lymphs Abs: 3.1 10*3/uL (ref 0.7–4.0)
MCHC: 33.3 g/dL (ref 30.0–36.0)
MCV: 95.1 fl (ref 78.0–100.0)
Monocytes Absolute: 0.9 10*3/uL (ref 0.1–1.0)
Monocytes Relative: 10.5 % (ref 3.0–12.0)
Neutro Abs: 4 10*3/uL (ref 1.4–7.7)
Neutrophils Relative %: 48.3 % (ref 43.0–77.0)
Platelets: 189 10*3/uL (ref 150.0–400.0)
RBC: 3.92 Mil/uL (ref 3.87–5.11)
RDW: 13.4 % (ref 11.5–15.5)
WBC: 8.3 10*3/uL (ref 4.0–10.5)

## 2022-07-07 LAB — BASIC METABOLIC PANEL
BUN: 13 mg/dL (ref 6–23)
CO2: 23 mEq/L (ref 19–32)
Calcium: 9 mg/dL (ref 8.4–10.5)
Chloride: 110 mEq/L (ref 96–112)
Creatinine, Ser: 0.67 mg/dL (ref 0.40–1.20)
GFR: 108.87 mL/min (ref >60.00)
Glucose, Bld: 88 mg/dL (ref 70–99)
Potassium: 3.5 mEq/L (ref 3.5–5.1)
Sodium: 139 mEq/L (ref 135–145)

## 2022-07-07 NOTE — Patient Instructions (Signed)
Hypertension, Adult High blood pressure (hypertension) is when the force of blood pumping through the arteries is too strong. The arteries are the blood vessels that carry blood from the heart throughout the body. Hypertension forces the heart to work harder to pump blood and may cause arteries to become narrow or stiff. Untreated or uncontrolled hypertension can lead to a heart attack, heart failure, a stroke, kidney disease, and other problems. A blood pressure reading consists of a higher number over a lower number. Ideally, your blood pressure should be below 120/80. The first ("top") number is called the systolic pressure. It is a measure of the pressure in your arteries as your heart beats. The second ("bottom") number is called the diastolic pressure. It is a measure of the pressure in your arteries as the heart relaxes. What are the causes? The exact cause of this condition is not known. There are some conditions that result in high blood pressure. What increases the risk? Certain factors may make you more likely to develop high blood pressure. Some of these risk factors are under your control, including: Smoking. Not getting enough exercise or physical activity. Being overweight. Having too much fat, sugar, calories, or salt (sodium) in your diet. Drinking too much alcohol. Other risk factors include: Having a personal history of heart disease, diabetes, high cholesterol, or kidney disease. Stress. Having a family history of high blood pressure and high cholesterol. Having obstructive sleep apnea. Age. The risk increases with age. What are the signs or symptoms? High blood pressure may not cause symptoms. Very high blood pressure (hypertensive crisis) may cause: Headache. Fast or irregular heartbeats (palpitations). Shortness of breath. Nosebleed. Nausea and vomiting. Vision changes. Severe chest pain, dizziness, and seizures. How is this diagnosed? This condition is diagnosed by  measuring your blood pressure while you are seated, with your arm resting on a flat surface, your legs uncrossed, and your feet flat on the floor. The cuff of the blood pressure monitor will be placed directly against the skin of your upper arm at the level of your heart. Blood pressure should be measured at least twice using the same arm. Certain conditions can cause a difference in blood pressure between your right and left arms. If you have a high blood pressure reading during one visit or you have normal blood pressure with other risk factors, you may be asked to: Return on a different day to have your blood pressure checked again. Monitor your blood pressure at home for 1 week or longer. If you are diagnosed with hypertension, you may have other blood or imaging tests to help your health care provider understand your overall risk for other conditions. How is this treated? This condition is treated by making healthy lifestyle changes, such as eating healthy foods, exercising more, and reducing your alcohol intake. You may be referred for counseling on a healthy diet and physical activity. Your health care provider may prescribe medicine if lifestyle changes are not enough to get your blood pressure under control and if: Your systolic blood pressure is above 130. Your diastolic blood pressure is above 80. Your personal target blood pressure may vary depending on your medical conditions, your age, and other factors. Follow these instructions at home: Eating and drinking  Eat a diet that is high in fiber and potassium, and low in sodium, added sugar, and fat. An example of this eating plan is called the DASH diet. DASH stands for Dietary Approaches to Stop Hypertension. To eat this way: Eat   plenty of fresh fruits and vegetables. Try to fill one half of your plate at each meal with fruits and vegetables. Eat whole grains, such as whole-wheat pasta, brown rice, or whole-grain bread. Fill about one  fourth of your plate with whole grains. Eat or drink low-fat dairy products, such as skim milk or low-fat yogurt. Avoid fatty cuts of meat, processed or cured meats, and poultry with skin. Fill about one fourth of your plate with lean proteins, such as fish, chicken without skin, beans, eggs, or tofu. Avoid pre-made and processed foods. These tend to be higher in sodium, added sugar, and fat. Reduce your daily sodium intake. Many people with hypertension should eat less than 1,500 mg of sodium a day. Do not drink alcohol if: Your health care provider tells you not to drink. You are pregnant, may be pregnant, or are planning to become pregnant. If you drink alcohol: Limit how much you have to: 0-1 drink a day for women. 0-2 drinks a day for men. Know how much alcohol is in your drink. In the [place] one drink equals one 12 oz bottle of beer (355 mL), one 5 oz glass of wine (148 mL), or one 1 oz glass of hard liquor (44 mL). Lifestyle  Work with your health care provider to maintain a healthy body weight or to lose weight. Ask what an ideal weight is for you. Get at least 30 minutes of exercise that causes your heart to beat faster (aerobic exercise) most days of the week. Activities may include walking, swimming, or biking. Include exercise to strengthen your muscles (resistance exercise), such as Pilates or lifting weights, as part of your weekly exercise routine. Try to do these types of exercises for 30 minutes at least 3 days a week. Do not use any products that contain nicotine or tobacco. These products include cigarettes, chewing tobacco, and vaping devices, such as e-cigarettes. If you need help quitting, ask your health care provider. Monitor your blood pressure at home as told by your health care provider. Keep all follow-up visits. This is important. Medicines Take over-the-counter and prescription medicines only as told by your health care provider. Follow directions carefully. Blood  pressure medicines must be taken as prescribed. Do not skip doses of blood pressure medicine. Doing this puts you at risk for problems and can make the medicine less effective. Ask your health care provider about side effects or reactions to medicines that you should watch for. Contact a health care provider if you: Think you are having a reaction to a medicine you are taking. Have headaches that keep coming back (recurring). Feel dizzy. Have swelling in your ankles. Have trouble with your vision. Get help right away if you: Develop a severe headache or confusion. Have unusual weakness or numbness. Feel faint. Have severe pain in your chest or abdomen. Vomit repeatedly. Have trouble breathing. These symptoms may be an emergency. Get help right away. Call 911. Do not wait to see if the symptoms will go away. Do not drive yourself to the hospital. Summary Hypertension is when the force of blood pumping through your arteries is too strong. If this condition is not controlled, it may put you at risk for serious complications. Your personal target blood pressure may vary depending on your medical conditions, your age, and other factors. For most people, a normal blood pressure is less than 120/80. Hypertension is treated with lifestyle changes, medicines, or a combination of both. Lifestyle changes include losing weight, eating a healthy,   low-sodium diet, exercising more, and limiting alcohol. This information is not intended to replace advice given to you by your health care provider. Make sure you discuss any questions you have with your health care provider. Document Revised: 01/08/2021 Document Reviewed: 01/08/2021 Elsevier Patient Education  2023 [employer] Inc.  

## 2022-07-07 NOTE — Progress Notes (Unsigned)
   Subjective:  Patient ID: [name], female    DOB: 10-07-1980  Age: 42 y.o. MRN: [id number]  CC: No chief complaint on file.   HPI JEANET LUPE presents for ***  Outpatient Medications Prior to Visit  Medication Sig Dispense Refill   amLODipine (NORVASC) 10 MG tablet Take 1 tablet (10 mg total) by mouth daily. 90 tablet 1   levocetirizine (XYZAL) 5 MG tablet Take 1 tablet (5 mg total) by mouth every evening. 90 tablet 1   triamcinolone cream (KENALOG) 0.5 % Apply 1 application. topically 3 (three) times daily. 60 g 1   Ubrogepant (UBRELVY) 100 MG TABS Take 1 tablet by mouth daily as needed. 1 tablet 0   No facility-administered medications prior to visit.    ROS Review of Systems  Objective:  There were no vitals taken for this visit.  BP Readings from Last 3 Encounters:  07/22/21 138/88  06/06/21 (!) 148/94  12/10/20 (!) 154/91    Wt Readings from Last 3 Encounters:  07/22/21 151 lb (68.5 kg)  06/06/21 153 lb (69.4 kg)  12/10/20 156 lb (70.8 kg)    Physical Exam  Lab Results  Component Value Date   WBC 5.9 06/06/2021   HGB 12.5 06/06/2021   HCT 38.2 06/06/2021   PLT 189.0 06/06/2021   GLUCOSE 87 06/06/2021   ALT 17 06/06/2021   AST 20 06/06/2021   NA 139 06/06/2021   K 3.7 06/06/2021   CL 107 06/06/2021   CREATININE 0.63 06/06/2021   BUN 11 06/06/2021   CO2 26 06/06/2021   TSH 1.12 06/06/2021    No results found.  Assessment & Plan:  There are no diagnoses linked to this encounter.   Follow-up: No follow-ups on file.  Sanda Linger, MD

## 2022-07-08 ENCOUNTER — Encounter: Payer: Self-pay | Admitting: Internal Medicine

## 2022-07-08 ENCOUNTER — Encounter: Payer: Self-pay | Admitting: Obstetrics and Gynecology

## 2022-07-08 MED ORDER — TRIAMTERENE-HCTZ 37.5-25 MG PO CAPS: 1.0000 | ORAL_CAPSULE | Freq: Every day | ORAL | 0 refills | Status: DC

## 2022-07-14 ENCOUNTER — Encounter: Payer: Self-pay | Admitting: Internal Medicine

## 2022-07-28 ENCOUNTER — Ambulatory Visit: Payer: Medicaid Other | Admitting: Internal Medicine

## 2022-08-07 ENCOUNTER — Ambulatory Visit: Payer: BC Managed Care – PPO | Admitting: Internal Medicine

## 2022-08-07 VITALS — BP 136/84 | HR 72 | Temp 98.6°F | Resp 16 | Ht 59.0 in | Wt 150.0 lb

## 2022-08-07 DIAGNOSIS — L03115 Cellulitis of right lower limb: Secondary | ICD-10-CM | POA: Insufficient documentation

## 2022-08-07 DIAGNOSIS — L231 Allergic contact dermatitis due to adhesives: Secondary | ICD-10-CM | POA: Diagnosis not present

## 2022-08-07 DIAGNOSIS — B356 Tinea cruris: Secondary | ICD-10-CM

## 2022-08-07 MED ORDER — SULFAMETHOXAZOLE-TRIMETHOPRIM 200-40 MG/5ML PO SUSP: 20.0000 mL | Freq: Two times a day (BID) | ORAL | 0 refills | Status: AC

## 2022-08-07 MED ORDER — TRIAMCINOLONE ACETONIDE 0.5 % EX CREA: 1.0000 | TOPICAL_CREAM | Freq: Three times a day (TID) | CUTANEOUS | 1 refills | Status: DC

## 2022-08-07 NOTE — Patient Instructions (Signed)

## 2022-08-07 NOTE — Progress Notes (Signed)
Subjective:  Patient ID: [name], female    DOB: [birth year]  Age: 42 y.o. MRN: [id number]  CC: Rash   HPI [name] presents for f/up  -----  She has had a cyst over her right upper thigh for several weeks that ruptured and has drained pus for several days. She covered it with an adhesive but this has caused rash/itching.  Outpatient Medications Prior to Visit  Medication Sig Dispense Refill  . levocetirizine (XYZAL) 5 MG tablet Take 1 tablet (5 mg total) by mouth every evening. 90 tablet 1  . triamterene-hydrochlorothiazide (DYAZIDE) 37.5-25 MG capsule Take 1 each (1 capsule total) by mouth daily. 90 capsule 0  . Ubrogepant (UBRELVY) 100 MG TABS Take 1 tablet by mouth daily as needed. 1 tablet 0  . triamcinolone cream (KENALOG) 0.5 % Apply 1 application. topically 3 (three) times daily. 60 g 1   No facility-administered medications prior to visit.    ROS Review of Systems  Constitutional:  Negative for chills, fatigue and fever.  HENT: Negative.    Eyes: Negative.   Respiratory: Negative.  Negative for cough, chest tightness, shortness of breath and wheezing.   Cardiovascular:  Negative for chest pain, palpitations and leg swelling.  Gastrointestinal: Negative.  Negative for abdominal pain.   Objective:  BP 136/84 (BP Location: Left Arm, Patient Position: Sitting, Cuff Size: Large)   Pulse 72   Temp 98.6 F (37 C) (Oral)   Resp 16   Ht 4\' 11"  (1.499 m)   Wt 150 lb (68 kg)   LMP 08/06/2022 (Exact Date) Comment: BTL  SpO2 98%   BMI 30.30 kg/m   BP Readings from Last 3 Encounters:  08/07/22 136/84  07/07/22 (!) 136/96  07/22/21 138/88    Wt Readings from Last 3 Encounters:  08/07/22 150 lb (68 kg)  07/07/22 150 lb (68 kg)  07/22/21 151 lb (68.5 kg)    Physical Exam Vitals reviewed.  Constitutional:      Appearance: Normal appearance. She is not ill-appearing.  HENT:     Nose: Nose normal.     Mouth/Throat:     Mouth: Mucous membranes  are moist.  Eyes:     General: No scleral icterus.    Conjunctiva/sclera: Conjunctivae normal.  Cardiovascular:     Rate and Rhythm: Normal rate and regular rhythm.     Heart sounds: No murmur heard. Pulmonary:     Effort: Pulmonary effort is normal.     Breath sounds: No stridor. No wheezing, rhonchi or rales.  Abdominal:     General: Abdomen is flat.     Palpations: There is no mass.     Tenderness: There is no abdominal tenderness. There is no guarding.     Hernia: No hernia is present.  Musculoskeletal:        General: Normal range of motion.     Cervical back: Neck supple.     Right lower leg: No edema.     Left lower leg: No edema.  Lymphadenopathy:     Cervical: No cervical adenopathy.  Skin:    General: Skin is warm.     Findings: Lesion and rash present. Rash is papular and scaling. Rash is not nodular or pustular.     Comments: Right thigh - there is a 1 cm hard nodule with no induration, fluctuance, streaking, or tenderness. The overlying skin shows hyperpigmentation with papules.  Neurological:     General: No focal deficit present.  Mental Status: She is alert. Mental status is at baseline.  Psychiatric:        Behavior: Behavior normal.   Lab Results  Component Value Date   WBC 8.3 07/07/2022   HGB 12.4 07/07/2022   HCT 37.3 07/07/2022   PLT 189.0 07/07/2022   GLUCOSE 88 07/07/2022   ALT 17 06/06/2021   AST 20 06/06/2021   NA 139 07/07/2022   K 3.5 07/07/2022   CL 110 07/07/2022   CREATININE 0.67 07/07/2022   BUN 13 07/07/2022   CO2 23 07/07/2022   TSH 1.12 06/06/2021    No results found.  Assessment & Plan:  Cellulitis of right lower extremity -     Sulfamethoxazole-Trimethoprim; Take 20 mLs by mouth 2 (two) times daily for 10 days.  Dispense: 400 mL; Refill: 0  Allergic contact dermatitis due to adhesives -     Triamcinolone Acetonide; Apply 1 Application topically 3 (three) times daily.  Dispense: 30 g; Refill: 1  Tinea cruris      Follow-up: Return in about 3 months (around 11/07/2022).  Sanda Linger, MD

## 2022-08-08 ENCOUNTER — Encounter: Payer: Self-pay | Admitting: Internal Medicine

## 2022-08-08 NOTE — Assessment & Plan Note (Signed)
I do not feel an abscess that should be draines and no exudate to culture This is like staph - will treat with SMX/TMP.

## 2022-08-08 NOTE — Assessment & Plan Note (Signed)
Will treat with a topical steroid 

## 2022-08-25 ENCOUNTER — Ambulatory Visit: Payer: BC Managed Care – PPO | Admitting: Dermatology

## 2022-08-25 ENCOUNTER — Encounter: Payer: Self-pay | Admitting: Dermatology

## 2022-08-25 DIAGNOSIS — L723 Sebaceous cyst: Secondary | ICD-10-CM | POA: Diagnosis not present

## 2022-08-25 DIAGNOSIS — L299 Pruritus, unspecified: Secondary | ICD-10-CM | POA: Diagnosis not present

## 2022-08-25 NOTE — Progress Notes (Signed)
New Patient Visit   Subjective  [name] is a 42 y.o. female who presents for the following: cyst at right inguinal crease. Irritation at B/L inguinal creases. Has no other cysts at any other areas. Dur: 3 months. Ruptured on it's on a couple of months ago but has filled back up. Was seen PCP and given an oral antibiotic, Bactrim suspension. Ruptured again this morning.  C/O itching all over.  PCP prescribed Triamcinolone for irritation from band-aid. Has to keep it with her all the time due to excessive itching. Works with boxes at [employer] Has been taking Xyzal as needed for itching.   The following portions of the chart were reviewed this encounter and updated as appropriate: medications, allergies, medical history  Review of Systems:  No other skin or systemic complaints except as noted in HPI or Assessment and Plan.  Objective  Well appearing patient in no apparent distress; mood and affect are within normal limits.  A focused examination was performed of the following areas: Arms, groin, legs  Relevant exam findings are noted in the Assessment and Plan.  Right Medial Thigh Erythematous, inflamed cystic nodule    Assessment & Plan   Inflamed epidermoid cyst of skin Right Medial Thigh  Finish Bactrim Suspension as directed.  Recommend OTC benzoyl peroxide cleanser, wash affected areas daily in shower, let sit several minutes prior to rinsing.  May bleach towels if not rinsed off completely.  Recommended brands include Panoxyl 4% Creamy Wash, CeraVe Acne Foaming Cream wash, or Cetaphil Gentle Clear Complexion-Clearing BPO Acne Cleanser.   Bacterial C&S performed today.  Anaerobic and Aerobic Culture - Right Medial Thigh  Intralesional injection - Right Medial Thigh Location: right medial thigh  Informed Consent: Discussed risks (infection, pain, bleeding, bruising, thinning of the skin, loss of skin pigment, lack of resolution, and recurrence of lesion)  and benefits of the procedure, as well as the alternatives. Informed consent was obtained. Preparation: The area was prepared a standard fashion.  Anesthesia: none  Procedure Details: An intralesional injection was performed with Kenalog 10 mg/cc. 0.15 cc in total were injected.  Total number of injections: 1  Plan: The patient was instructed on post-op care. Recommend OTC analgesia as needed for pain.  NDC: 1610-9604-54 Lot: [id number] Exp: 02/2024     Pruritus Exam: No active lesions today  Chronic and persistent condition with duration or expected duration over one year. Condition is symptomatic/ bothersome to patient. Not currently at goal.   Treatment Plan:  Take Levocetirizine  5 mg every night at bedtime  Use Triamcinolone cream twice daily to affected areas as needed for itching. Avoid applying to face, groin, and axilla. Use as directed. Long-term use can cause thinning of the skin.  Topical steroids (such as triamcinolone, fluocinolone, fluocinonide, mometasone, clobetasol, halobetasol, betamethasone, hydrocortisone) can cause thinning and lightening of the skin if they are used for too long in the same area. Your physician has selected the right strength medicine for your problem and area affected on the body. Please use your medication only as directed by your physician to prevent side effects.    Recommend OTC Gold Bond Rapid Relief Anti-Itch cream (pramoxine + menthol), CeraVe Anti-itch cream or lotion (pramoxine), Sarna lotion (Original- menthol + camphor or Sensitive- pramoxine) or Eucerin 12 hour Itch Relief lotion (menthol) up to 3 times per day to areas on body that are itchy.      Return in about 2 months (around 10/25/2022).  I, Lawson Radar,  CMA, am acting as scribe for [employer], DO.   Documentation: I have reviewed the above documentation for accuracy and completeness, and I agree with the above.  Langston Reusing, DO

## 2022-08-25 NOTE — Patient Instructions (Addendum)
Finish oral antibiotic suspension as directed by primary care physician.  Take Levocetirizine  5 mg every night at bedtime  Use Triamcinolone cream twice daily to affected areas up to 2 weeks as needed for itching. Avoid applying to face, groin, and axilla. Use as directed. Long-term use can cause thinning of the skin.  Topical steroids (such as triamcinolone, fluocinolone, fluocinonide, mometasone, clobetasol, halobetasol, betamethasone, hydrocortisone) can cause thinning and lightening of the skin if they are used for too long in the same area. Your physician has selected the right strength medicine for your problem and area affected on the body. Please use your medication only as directed by your physician to prevent side effects.    Recommend OTC Gold Bond Rapid Relief Anti-Itch cream (pramoxine + menthol), CeraVe Anti-itch cream or lotion (pramoxine), Sarna lotion (Original- menthol + camphor or Sensitive- pramoxine) or Eucerin 12 hour Itch Relief lotion (menthol) up to 3 times per day to areas on body that are itchy.   Intralesional steroid injection side effects were reviewed including thinning of the skin and discoloration, such as redness, lightening or darkening.   Due to recent changes in healthcare laws, you may see results of your pathology and/or laboratory studies on MyChart before the doctors have had a chance to review them. We understand that in some cases there may be results that are confusing or concerning to you. Please understand that not all results are received at the same time and often the doctors may need to interpret multiple results in order to provide you with the best plan of care or course of treatment. Therefore, we ask that you please give [place] 2 business days to thoroughly review all your results before contacting the office for clarification. Should we see a critical lab result, you will be contacted sooner.   If You Need Anything After Your Visit  If you have  any questions or concerns for your doctor, please call our main line at [phone] If no one answers, please leave a voicemail as directed and we will return your call as soon as possible. Messages left after 4 pm will be answered the following business day.   You may also send [place] a message via MyChart. We typically respond to MyChart messages within 1-2 business days.  For prescription refills, please ask your pharmacy to contact our office. Our fax number is [phone].  If you have an urgent issue when the clinic is closed that cannot wait until the next business day, you can page your doctor at the number below.    Please note that while we do our best to be available for urgent issues outside of office hours, we are not available 24/7.   If you have an urgent issue and are unable to reach [place], you may choose to seek medical care at your doctor's office, retail clinic, urgent care center, or emergency room.  If you have a medical emergency, please immediately call 911 or go to the emergency department. In the event of inclement weather, please call our main line at [phone] for an update on the status of any delays or closures.  Dermatology Medication Tips: Please keep the boxes that topical medications come in in order to help keep track of the instructions about where and how to use these. Pharmacies typically print the medication instructions only on the boxes and not directly on the medication tubes.   If your medication is too expensive, please contact our office at [phone] or send [place]  a message through MyChart.   We are unable to tell what your co-pay for medications will be in advance as this is different depending on your insurance coverage. However, we may be able to find a substitute medication at lower cost or fill out paperwork to get insurance to cover a needed medication.   If a prior authorization is required to get your medication covered by your insurance  company, please allow [place] 1-2 business days to complete this process.  Drug prices often vary depending on where the prescription is filled and some pharmacies may offer cheaper prices.  The website www.goodrx.com contains coupons for medications through different pharmacies. The prices here do not account for what the cost may be with help from insurance (it may be cheaper with your insurance), but the website can give you the price if you did not use any insurance.  - You can print the associated coupon and take it with your prescription to the pharmacy.  - You may also stop by our office during regular business hours and pick up a GoodRx coupon card.  - If you need your prescription sent electronically to a different pharmacy, notify our office through [facility] or by phone at [phone]

## 2022-08-29 LAB — ANAEROBIC AND AEROBIC CULTURE

## 2022-09-02 ENCOUNTER — Encounter: Payer: Self-pay | Admitting: Dermatology

## 2022-10-06 ENCOUNTER — Ambulatory Visit: Payer: BC Managed Care – PPO | Admitting: Obstetrics and Gynecology

## 2022-10-06 ENCOUNTER — Other Ambulatory Visit (HOSPITAL_COMMUNITY)
Admission: RE | Admit: 2022-10-06 | Discharge: 2022-10-06 | Disposition: A | Discharge status: Home / Self Care | Payer: BC Managed Care – PPO | Source: Ambulatory Visit | Attending: Obstetrics and Gynecology | Admitting: Obstetrics and Gynecology

## 2022-10-06 ENCOUNTER — Other Ambulatory Visit: Payer: Self-pay

## 2022-10-06 ENCOUNTER — Encounter: Payer: Self-pay | Admitting: Obstetrics and Gynecology

## 2022-10-06 VITALS — BP 137/97 | HR 76 | Ht 59.0 in | Wt 150.8 lb

## 2022-10-06 DIAGNOSIS — N870 Mild cervical dysplasia: Secondary | ICD-10-CM | POA: Diagnosis not present

## 2022-10-06 DIAGNOSIS — Z01419 Encounter for gynecological examination (general) (routine) without abnormal findings: Secondary | ICD-10-CM | POA: Insufficient documentation

## 2022-10-06 DIAGNOSIS — N898 Other specified noninflammatory disorders of vagina: Secondary | ICD-10-CM | POA: Diagnosis not present

## 2022-10-06 DIAGNOSIS — R102 Pelvic and perineal pain: Secondary | ICD-10-CM

## 2022-10-06 DIAGNOSIS — Z202 Contact with and (suspected) exposure to infections with a predominantly sexual mode of transmission: Secondary | ICD-10-CM

## 2022-10-06 NOTE — Progress Notes (Signed)
[name] is a 42 y.o. [phone] female here for a routine annual gynecologic exam.  Current complaints: intermittent pelvic cramps. Not associated with any particular activities or her cycles. Cycles are monthly last 4-5 days, heavy with cramps at first. Stopped OCP's over 1 yr ago. Was taking for the [facility] and cramps. Did not notice any improvement Sexual active without problems. Desires STD testing    Gynecologic History Patient's last menstrual period was 09/28/2022 (exact date). Contraception: tubal ligation Last Pap: 9/22. Results were: normal Last mammogram: uncertain.   Obstetric History OB History  Gravida Para Term Preterm AB Living  4 3 0   1 3  SAB IAB Ectopic Multiple Live Births  1            # Outcome Date GA Lbr Len/2nd Weight Sex Type Anes PTL Lv  4 Para 2008     Vag-Spont     3 Para 2006     Vag-Spont     2 Para 2004     CS-LTranv     1 SAB             Past Medical History:  Diagnosis Date   No pertinent past medical history     Past Surgical History:  Procedure Laterality Date   CESAREAN SECTION  2004   DILATION AND CURETTAGE OF UTERUS  2007   TUBAL LIGATION  2008    Current Outpatient Medications on File Prior to Visit  Medication Sig Dispense Refill   levocetirizine (XYZAL) 5 MG tablet Take 1 tablet (5 mg total) by mouth every evening. 90 tablet 1   triamcinolone cream (KENALOG) 0.5 % Apply 1 Application topically 3 (three) times daily. 30 g 1   triamterene-hydrochlorothiazide (DYAZIDE) 37.5-25 MG capsule Take 1 each (1 capsule total) by mouth daily. 90 capsule 0   Ubrogepant (UBRELVY) 100 MG TABS Take 1 tablet by mouth daily as needed. (Patient not taking: Reported on 10/06/2022) 1 tablet 0   No current facility-administered medications on file prior to visit.    Allergies  Allergen Reactions   Shellfish Allergy Anaphylaxis   Iodine Itching    Social History   Socioeconomic History   Marital status: Single    Spouse name: Not on file    Number of children: Not on file   Years of education: Not on file   Highest education level: GED or equivalent  Occupational History   Not on file  Tobacco Use   Smoking status: Former    Types: Cigars    Passive exposure: Never   Smokeless tobacco: Never  Substance and Sexual Activity   Alcohol use: Yes    Alcohol/week: 2.0 standard drinks of alcohol    Types: 2 Glasses of wine per week   Drug use: No   Sexual activity: Yes    Partners: Male    Birth control/protection: Surgical  Other Topics Concern   Not on file  Social History Narrative   Not on file   Social Determinants of Health   Financial Resource Strain: Medium Risk (08/07/2022)   Overall Financial Resource Strain (CARDIA)    Difficulty of Paying Living Expenses: Somewhat hard  Food Insecurity: Food Insecurity Present (08/07/2022)   Hunger Vital Sign    Worried About Running Out of Food in the Last Year: Patient declined    Ran Out of Food in the Last Year: Sometimes true  Transportation Needs: Patient Declined (08/07/2022)   PRAPARE - Transportation    Lack of Transportation (  Medical): Patient declined    Lack of Transportation (Non-Medical): Patient declined  Physical Activity: Insufficiently Active (08/07/2022)   Exercise Vital Sign    Days of Exercise per Week: 2 days    Minutes of Exercise per Session: 40 min  Stress: No Stress Concern Present (08/07/2022)   [employer] of Occupational Health - Occupational Stress Questionnaire    Feeling of Stress : Not at all  Social Connections: Moderately Isolated (08/07/2022)   Social Connection and Isolation Panel [NHANES]    Frequency of Communication with Friends and Family: More than three times a week    Frequency of Social Gatherings with Friends and Family: Twice a week    Attends Religious Services: More than 4 times per year    Active Member of [employer] or Organizations: No    Attends Engineer, structural: Not on file    Marital Status: Never  married  Catering manager Violence: Not on file    Family History  Problem Relation Age of Onset   Hypertension Mother    Stroke Mother    Hyperlipidemia Mother    Seizures Mother    Kidney failure Father    Cirrhosis Father     The following portions of the patient's history were reviewed and updated as appropriate: allergies, current medications, past family history, past medical history, past social history, past surgical history and problem list.  Review of Systems Pertinent items noted in HPI and remainder of comprehensive ROS otherwise negative.   Objective:  BP (!) 137/97   Pulse 76   Ht 4\' 11"  (1.499 m)   Wt 150 lb 12.8 oz (68.4 kg)   LMP 09/28/2022 (Exact Date)   BMI 30.46 kg/m  Chaperone present CONSTITUTIONAL: Well-developed, well-nourished female in no acute distress.  HENT:  Normocephalic, atraumatic, External right and left ear normal. Oropharynx is clear and moist EYES: Conjunctivae and EOM are normal. Pupils are equal, round, and reactive to light. No scleral icterus.  NECK: Normal range of motion, supple, no masses.  Normal thyroid.  SKIN: Skin is warm and dry. No rash noted. Not diaphoretic. No erythema. No pallor. NEUROLGIC: Alert and oriented to person, place, and time. Normal reflexes, muscle tone coordination. No cranial nerve deficit noted. PSYCHIATRIC: Normal mood and affect. Normal behavior. Normal judgment and thought content. CARDIOVASCULAR: Normal heart rate noted, regular rhythm RESPIRATORY: Clear to auscultation bilaterally. Effort and breath sounds normal, no problems with respiration noted. BREASTS: Symmetric in size. No masses, skin changes, nipple drainage, or lymphadenopathy. ABDOMEN: Soft, normal bowel sounds, no distention noted.  No tenderness, rebound or guarding.  PELVIC: Normal appearing external genitalia; normal appearing vaginal mucosa and cervix.  No abnormal discharge noted.  Pap smear obtained.  Normal uterine size, no other  palpable masses, no uterine or adnexal tenderness. MUSCULOSKELETAL: Normal range of motion. No tenderness.  No cyanosis, clubbing, or edema.  2+ distal pulses.   Assessment:  Annual gynecologic examination with pap smear   Plan:  Will follow up results of pap smear and manage accordingly. Mammogram scheduled. STD testing as per pt request. Will check GYN U/S. Advised to follow up with PCP for elevated BP Routine preventative health maintenance measures emphasized. Please refer to After Visit Summary for other counseling recommendations.  F/U in 4 weeks to discuss GYN U/S results  Hermina Staggers, MD, FACOG Attending Obstetrician & Gynecologist [facility], [facility]

## 2022-10-07 ENCOUNTER — Telehealth: Payer: Self-pay

## 2022-10-07 DIAGNOSIS — B9689 Other specified bacterial agents as the cause of diseases classified elsewhere: Secondary | ICD-10-CM

## 2022-10-07 DIAGNOSIS — N760 Acute vaginitis: Secondary | ICD-10-CM

## 2022-10-07 LAB — CERVICOVAGINAL ANCILLARY ONLY
Bacterial Vaginitis (gardnerella): POSITIVE — AB
Candida Glabrata: NEGATIVE
Candida Vaginitis: NEGATIVE
Chlamydia: NEGATIVE
Comment: NEGATIVE
Comment: NEGATIVE
Comment: NEGATIVE
Comment: NEGATIVE
Comment: NEGATIVE
Comment: NORMAL
Neisseria Gonorrhea: NEGATIVE
Trichomonas: NEGATIVE

## 2022-10-07 MED ORDER — METRONIDAZOLE 500 MG PO TABS
500.0000 mg | ORAL_TABLET | Freq: Two times a day (BID) | ORAL | 0 refills | Status: DC
Start: 2022-10-07 — End: 2022-10-08

## 2022-10-07 NOTE — Telephone Encounter (Signed)
-----   Message from Hermina Staggers sent at 10/07/2022  2:46 PM EDT ----- Please let pt know her vaginal swab results and send in Rx for TX as per protocol. Thanks Casimiro Needle

## 2022-10-07 NOTE — Telephone Encounter (Signed)
Called patient at number listed in chart--sent to unidentified VM--left message to call us back in regards to test results and further treatment r/t results; also informed patient that I will send her a MyChart message r/t test results and next steps.   Rx sent into pharmacy on file for treatment of BV per provider recommendation below. MyChart message sent to patient.   Maureen Ralphs RN on 10/07/22 at [phone]

## 2022-10-08 LAB — CYTOLOGY - PAP
Comment: NEGATIVE
Diagnosis: UNDETERMINED — AB
High risk HPV: NEGATIVE

## 2022-10-08 MED ORDER — METRONIDAZOLE 0.75 % VA GEL: 1.0000 | Freq: Two times a day (BID) | VAGINAL | 0 refills | Status: AC

## 2022-10-08 NOTE — Telephone Encounter (Signed)
Please see MyChart message conversation for further interaction.   Maureen Ralphs RN on 10/08/22 at [phone]

## 2022-10-09 ENCOUNTER — Encounter: Payer: Self-pay | Admitting: Obstetrics and Gynecology

## 2022-10-09 DIAGNOSIS — R87610 Atypical squamous cells of undetermined significance on cytologic smear of cervix (ASC-US): Secondary | ICD-10-CM | POA: Insufficient documentation

## 2022-10-10 ENCOUNTER — Ambulatory Visit (HOSPITAL_COMMUNITY)
Admission: RE | Admit: 2022-10-10 | Discharge: 2022-10-10 | Disposition: A | Discharge status: Home / Self Care | Payer: 59 | Source: Ambulatory Visit | Attending: Obstetrics and Gynecology | Admitting: Obstetrics and Gynecology

## 2022-10-10 DIAGNOSIS — Z202 Contact with and (suspected) exposure to infections with a predominantly sexual mode of transmission: Secondary | ICD-10-CM | POA: Diagnosis not present

## 2022-10-10 DIAGNOSIS — R9389 Abnormal findings on diagnostic imaging of other specified body structures: Secondary | ICD-10-CM | POA: Diagnosis not present

## 2022-10-10 DIAGNOSIS — N854 Malposition of uterus: Secondary | ICD-10-CM | POA: Diagnosis not present

## 2022-10-10 DIAGNOSIS — R102 Pelvic and perineal pain: Secondary | ICD-10-CM | POA: Diagnosis not present

## 2022-10-13 ENCOUNTER — Ambulatory Visit: Payer: BC Managed Care – PPO

## 2022-10-16 ENCOUNTER — Ambulatory Visit
Admission: RE | Admit: 2022-10-16 | Discharge: 2022-10-16 | Disposition: A | Discharge status: Home / Self Care | Payer: BC Managed Care – PPO | Source: Ambulatory Visit | Attending: Obstetrics and Gynecology | Admitting: Obstetrics and Gynecology

## 2022-10-16 DIAGNOSIS — Z01419 Encounter for gynecological examination (general) (routine) without abnormal findings: Secondary | ICD-10-CM

## 2022-10-16 DIAGNOSIS — Z1231 Encounter for screening mammogram for malignant neoplasm of breast: Secondary | ICD-10-CM | POA: Diagnosis not present

## 2022-10-27 ENCOUNTER — Encounter: Payer: Self-pay | Admitting: Dermatology

## 2022-10-27 ENCOUNTER — Ambulatory Visit (INDEPENDENT_AMBULATORY_CARE_PROVIDER_SITE_OTHER): Payer: 59 | Admitting: Dermatology

## 2022-10-27 VITALS — BP 127/73 | HR 72

## 2022-10-27 DIAGNOSIS — L723 Sebaceous cyst: Secondary | ICD-10-CM

## 2022-10-27 DIAGNOSIS — L732 Hidradenitis suppurativa: Secondary | ICD-10-CM | POA: Diagnosis not present

## 2022-10-27 DIAGNOSIS — L720 Epidermal cyst: Secondary | ICD-10-CM

## 2022-10-27 MED ORDER — TRIAMCINOLONE ACETONIDE 40 MG/ML IJ SUSP
40.0000 mg | Freq: Once | INTRAMUSCULAR | Status: AC
Start: 2022-10-27 — End: 2022-10-27
  Administered 2022-10-27: 40 mg via INTRAMUSCULAR

## 2022-10-27 MED ORDER — DOXYCYCLINE HYCLATE 100 MG PO CAPS: ORAL_CAPSULE | ORAL | 3 refills | Status: DC

## 2022-10-27 MED ORDER — CLINDAMYCIN PHOSPHATE 1 % EX LOTN: TOPICAL_LOTION | CUTANEOUS | 4 refills | Status: DC

## 2022-10-27 NOTE — Progress Notes (Signed)
   Follow-Up Visit   Subjective  [name] is a 42 y.o. female who presents for the following: cysts  Patient present today for follow up visit for cysts on inner thighs. Patient was last evaluated on 08/25/22. Patient reports cyst improved significantly after having steroid injection but "knot" came back after 1 week. Pt has been washing with BPO wash and using Cerave. Patient reports no medication changes.   The following portions of the chart were reviewed this encounter and updated as appropriate: medications, allergies, medical history  Review of Systems:  No other skin or systemic complaints except as noted in HPI or Assessment and Plan.  Objective  Well appearing patient in no apparent distress; mood and affect are within normal limits.  A focused examination was performed of the following areas: Bilateral inner thigh  Relevant exam findings are noted in the Assessment and Plan.  Right Thigh - Anterior HS    Assessment & Plan   HIDRADENITIS SUPPURATIVA   Chronic and persistent condition with duration or expected duration over one year. Condition is bothersome/symptomatic for patient. Currently flared.   Hidradenitis Suppurativa is a chronic; persistent; non-curable, but treatable condition due to abnormal inflamed sweat glands in the body folds (axilla, inframammary, groin, medial thighs), causing recurrent painful draining cysts and scarring. It can be associated with severe scarring acne and cysts; also abscesses and scarring of scalp. The goal is control and prevention of flares, as it is not curable. Scars are permanent and can be thickened. Treatment may include daily use of topical medication and oral antibiotics.  Oral isotretinoin may also be helpful.  For some cases, Humira or Cosentyx (biologic injections) may be prescribed to decrease the inflammatory process and prevent flares.  When indicated, inflamed cysts may also be treated surgically.  Treatment  Plan: -Doxycycline 100mg  take 1 twice a day for 7 days with food when flaring    - Clindamycin lotion: Apply daily to affected areas to help control symptoms. -Continue washing with BPO -Reduce or quit smoking to help manage HS -Monitor diet, reducing sugar and carbohydrates to decrease inflammation.    Inflamed epidermoid cyst of skin Right Thigh - Anterior  Intralesional injection - Right Thigh - Anterior Procedure Note Intralesional Injection  Location: right inner thigh  Informed Consent: Discussed risks (infection, pain, bleeding, bruising, thinning of the skin, loss of skin pigment, lack of resolution, and recurrence of lesion) and benefits of the procedure, as well as the alternatives. Informed consent was obtained. Preparation: The area was prepared a standard fashion.  Anesthesia:n/A  Procedure Details: An intralesional injection was performed with Kenalog 20mg /cc. . 1cc in total were injected. NDC #: 21308-657-84 Exp: 03/17/23  Total number of injections: 1  Plan: The patient was instructed on post-op care. Recommend OTC analgesia as needed for pain.   Hidradenitis suppurativa  Related Medications triamcinolone acetonide (KENALOG-40) injection 40 mg     Return in about 3 months (around 01/27/2023), or HS.  Owens Shark, CMA, am acting as scribe for [employer], DO.   Documentation: I have reviewed the above documentation for accuracy and completeness, and I agree with the above.  Langston Reusing, DO

## 2022-10-27 NOTE — Patient Instructions (Signed)
Hello Miss Dilorenzo,  Thank you for visiting [place] again and for your commitment to improving your health. It was good to see you and discuss the progress and current status of your condition. Here is a summary of the key instructions and recommendations from today's consultation:  - Diagnosis Update: Added diagnosis of hidradenitis suppurativa (HS). - Medications Prescribed:   - Doxycycline: Take once a day for seven days with food to avoid nausea. Start taking at the first sign of flare-up.   - Clindamycin lotion: Apply daily to affected areas to help control symptoms.  - Lifestyle Recommendations:   - Continue washing with benzoyl peroxide.   - Reduce or quit smoking to help manage HS and improve overall health.   - Monitor your diet, reducing sugar and carbohydrates to decrease inflammation.  - Follow-Up: Schedule a return visit in three months to assess the effectiveness of the treatment plan and adjust as necessary.  - Additional Instructions:   - If symptoms improve within five days, you may stop taking doxycycline earlier.   - Ensure to fill your prescriptions promptly to avoid cancellation.  We have sent all prescriptions to your pharmacy and expect you should notice relief within 24 hours. Please follow the treatment plan closely and do not hesitate to contact us if you have any questions or concerns before your next appointment.  Best regards,  Dr. Langston Reusing, Dermatology   Due to recent changes in healthcare laws, you may see results of your pathology and/or laboratory studies on MyChart before the doctors have had a chance to review them. We understand that in some cases there may be results that are confusing or concerning to you. Please understand that not all results are received at the same time and often the doctors may need to interpret multiple results in order to provide you with the best plan of care or course of treatment. Therefore, we ask that you please give [place] 2  business days to thoroughly review all your results before contacting the office for clarification. Should we see a critical lab result, you will be contacted sooner.   If You Need Anything After Your Visit  If you have any questions or concerns for your doctor, please call our main line at [phone] If no one answers, please leave a voicemail as directed and we will return your call as soon as possible. Messages left after 4 pm will be answered the following business day.   You may also send [place] a message via MyChart. We typically respond to MyChart messages within 1-2 business days.  For prescription refills, please ask your pharmacy to contact our office. Our fax number is [phone].  If you have an urgent issue when the clinic is closed that cannot wait until the next business day, you can page your doctor at the number below.    Please note that while we do our best to be available for urgent issues outside of office hours, we are not available 24/7.   If you have an urgent issue and are unable to reach [place], you may choose to seek medical care at your doctor's office, retail clinic, urgent care center, or emergency room.  If you have a medical emergency, please immediately call 911 or go to the emergency department. In the event of inclement weather, please call our main line at [phone] for an update on the status of any delays or closures.  Dermatology Medication Tips: Please keep the boxes that topical medications come  in in order to help keep track of the instructions about where and how to use these. Pharmacies typically print the medication instructions only on the boxes and not directly on the medication tubes.   If your medication is too expensive, please contact our office at [phone] or send [place] a message through MyChart.   We are unable to tell what your co-pay for medications will be in advance as this is different depending on your insurance coverage. However, we  may be able to find a substitute medication at lower cost or fill out paperwork to get insurance to cover a needed medication.   If a prior authorization is required to get your medication covered by your insurance company, please allow [place] 1-2 business days to complete this process.  Drug prices often vary depending on where the prescription is filled and some pharmacies may offer cheaper prices.  The website www.goodrx.com contains coupons for medications through different pharmacies. The prices here do not account for what the cost may be with help from insurance (it may be cheaper with your insurance), but the website can give you the price if you did not use any insurance.  - You can print the associated coupon and take it with your prescription to the pharmacy.  - You may also stop by our office during regular business hours and pick up a GoodRx coupon card.  - If you need your prescription sent electronically to a different pharmacy, notify our office through [facility] or by phone at [phone]

## 2022-10-29 ENCOUNTER — Encounter: Payer: Self-pay | Admitting: Dermatology

## 2022-10-29 ENCOUNTER — Other Ambulatory Visit: Payer: Self-pay

## 2022-10-29 ENCOUNTER — Ambulatory Visit (INDEPENDENT_AMBULATORY_CARE_PROVIDER_SITE_OTHER): Payer: BC Managed Care – PPO | Admitting: Obstetrics and Gynecology

## 2022-10-29 ENCOUNTER — Encounter: Payer: Self-pay | Admitting: Obstetrics and Gynecology

## 2022-10-29 VITALS — BP 131/87 | HR 80 | Ht 59.0 in | Wt 154.3 lb

## 2022-10-29 DIAGNOSIS — N920 Excessive and frequent menstruation with regular cycle: Secondary | ICD-10-CM | POA: Diagnosis not present

## 2022-10-29 DIAGNOSIS — R102 Pelvic and perineal pain: Secondary | ICD-10-CM | POA: Diagnosis not present

## 2022-10-29 MED ORDER — CLINDAMYCIN PHOSPHATE 1 % EX LOTN: TOPICAL_LOTION | Freq: Every day | CUTANEOUS | 4 refills | Status: AC

## 2022-10-29 MED ORDER — TRANEXAMIC ACID 650 MG PO TABS: 1300.0000 mg | ORAL_TABLET | Freq: Three times a day (TID) | ORAL | 2 refills | Status: DC

## 2022-10-29 NOTE — Progress Notes (Signed)
Resent pt's script per her request

## 2022-10-29 NOTE — Progress Notes (Signed)
Ms Buday presents for follow up  See prior office visit GYN U/S reviewed with pt  PE AF VSS Lungs clear Heart RRR Abd soft + BS  A/P Menorraghia with regular cycle        Dysmenorrhea  Tx options reviewed with pt. Following discussion pt desires to try Lystedia. U/R/B reviewed with pt.  To start with next cycle. F/U in 3 months

## 2022-10-30 ENCOUNTER — Other Ambulatory Visit: Payer: Self-pay

## 2022-11-10 ENCOUNTER — Encounter: Payer: Self-pay | Admitting: Internal Medicine

## 2022-11-10 DIAGNOSIS — L509 Urticaria, unspecified: Secondary | ICD-10-CM

## 2022-11-11 MED ORDER — LEVOCETIRIZINE DIHYDROCHLORIDE 5 MG PO TABS: 5.0000 mg | ORAL_TABLET | Freq: Every evening | ORAL | 0 refills | Status: DC

## 2022-12-02 ENCOUNTER — Encounter: Payer: Self-pay | Admitting: Obstetrics and Gynecology

## 2022-12-02 DIAGNOSIS — N939 Abnormal uterine and vaginal bleeding, unspecified: Secondary | ICD-10-CM

## 2022-12-02 MED ORDER — MEGESTROL ACETATE 20 MG PO TABS
20.0000 mg | ORAL_TABLET | Freq: Two times a day (BID) | ORAL | 2 refills | Status: DC
Start: 2022-12-02 — End: 2023-03-09

## 2022-12-16 ENCOUNTER — Encounter: Payer: Self-pay | Admitting: Internal Medicine

## 2022-12-17 ENCOUNTER — Other Ambulatory Visit: Payer: Self-pay | Admitting: Internal Medicine

## 2022-12-17 DIAGNOSIS — L2084 Intrinsic (allergic) eczema: Secondary | ICD-10-CM

## 2022-12-17 MED ORDER — TRIAMCINOLONE ACETONIDE 0.5 % EX CREA: 1.0000 | TOPICAL_CREAM | Freq: Three times a day (TID) | CUTANEOUS | 2 refills | Status: DC

## 2023-01-05 ENCOUNTER — Ambulatory Visit (INDEPENDENT_AMBULATORY_CARE_PROVIDER_SITE_OTHER): Payer: 59 | Admitting: Internal Medicine

## 2023-01-05 ENCOUNTER — Encounter: Payer: Self-pay | Admitting: Internal Medicine

## 2023-01-05 VITALS — BP 144/98 | HR 86 | Temp 98.9°F | Resp 16 | Ht 59.0 in | Wt 156.5 lb

## 2023-01-05 DIAGNOSIS — I10 Essential (primary) hypertension: Secondary | ICD-10-CM

## 2023-01-05 DIAGNOSIS — N921 Excessive and frequent menstruation with irregular cycle: Secondary | ICD-10-CM | POA: Diagnosis not present

## 2023-01-05 LAB — CBC WITH DIFFERENTIAL/PLATELET
Basophils Absolute: 0.1 10*3/uL (ref 0.0–0.1)
Basophils Relative: 1.3 % (ref 0.0–3.0)
Eosinophils Absolute: 0.3 10*3/uL (ref 0.0–0.7)
Eosinophils Relative: 3.7 % (ref 0.0–5.0)
HCT: 38.3 % (ref 36.0–46.0)
Hemoglobin: 12.4 g/dL (ref 12.0–15.0)
Lymphocytes Relative: 38.5 % (ref 12.0–46.0)
Lymphs Abs: 2.8 10*3/uL (ref 0.7–4.0)
MCHC: 32.4 g/dL (ref 30.0–36.0)
MCV: 96.4 fL (ref 78.0–100.0)
Monocytes Absolute: 0.7 10*3/uL (ref 0.1–1.0)
Monocytes Relative: 9 % (ref 3.0–12.0)
Neutro Abs: 3.5 10*3/uL (ref 1.4–7.7)
Neutrophils Relative %: 47.5 % (ref 43.0–77.0)
Platelets: 199 10*3/uL (ref 150.0–400.0)
RBC: 3.97 Mil/uL (ref 3.87–5.11)
RDW: 13.2 % (ref 11.5–15.5)
WBC: 7.4 10*3/uL (ref 4.0–10.5)

## 2023-01-05 LAB — BASIC METABOLIC PANEL
BUN: 12 mg/dL (ref 6–23)
CO2: 25 meq/L (ref 19–32)
Calcium: 9.3 mg/dL (ref 8.4–10.5)
Chloride: 109 meq/L (ref 96–112)
Creatinine, Ser: 0.73 mg/dL (ref 0.40–1.20)
GFR: 102.08 mL/min (ref >60.00)
Glucose, Bld: 94 mg/dL (ref 70–99)
Potassium: 3.7 meq/L (ref 3.5–5.1)
Sodium: 142 meq/L (ref 135–145)

## 2023-01-05 LAB — HEPATIC FUNCTION PANEL
ALT: 16 U/L (ref 0–35)
AST: 18 U/L (ref 0–37)
Albumin: 4.5 g/dL (ref 3.5–5.2)
Alkaline Phosphatase: 70 U/L (ref 39–117)
Bilirubin, Direct: 0 mg/dL (ref 0.0–0.3)
Total Bilirubin: 0.2 mg/dL (ref 0.2–1.2)
Total Protein: 7.4 g/dL (ref 6.0–8.3)

## 2023-01-05 LAB — PROTIME-INR
INR: 1.1 {ratio} — ABNORMAL HIGH (ref 0.8–1.0)
Prothrombin Time: 11.7 s (ref 9.6–13.1)

## 2023-01-05 LAB — APTT: aPTT: 34.6 s (ref 25.4–36.8)

## 2023-01-05 LAB — TSH: TSH: 0.65 u[IU]/mL (ref 0.35–5.50)

## 2023-01-05 LAB — HCG, QUANTITATIVE, PREGNANCY: Quantitative HCG: <0.6 m[IU]/mL

## 2023-01-05 MED ORDER — TRIAMTERENE-HCTZ 37.5-25 MG PO CAPS: 1.0000 | ORAL_CAPSULE | Freq: Every day | ORAL | 0 refills | Status: DC

## 2023-01-05 NOTE — Patient Instructions (Signed)
Hypertension, Adult High blood pressure (hypertension) is when the force of blood pumping through the arteries is too strong. The arteries are the blood vessels that carry blood from the heart throughout the body. Hypertension forces the heart to work harder to pump blood and may cause arteries to become narrow or stiff. Untreated or uncontrolled hypertension can lead to a heart attack, heart failure, a stroke, kidney disease, and other problems. A blood pressure reading consists of a higher number over a lower number. Ideally, your blood pressure should be below 120/80. The first ("top") number is called the systolic pressure. It is a measure of the pressure in your arteries as your heart beats. The second ("bottom") number is called the diastolic pressure. It is a measure of the pressure in your arteries as the heart relaxes. What are the causes? The exact cause of this condition is not known. There are some conditions that result in high blood pressure. What increases the risk? Certain factors may make you more likely to develop high blood pressure. Some of these risk factors are under your control, including: Smoking. Not getting enough exercise or physical activity. Being overweight. Having too much fat, sugar, calories, or salt (sodium) in your diet. Drinking too much alcohol. Other risk factors include: Having a personal history of heart disease, diabetes, high cholesterol, or kidney disease. Stress. Having a family history of high blood pressure and high cholesterol. Having obstructive sleep apnea. Age. The risk increases with age. What are the signs or symptoms? High blood pressure may not cause symptoms. Very high blood pressure (hypertensive crisis) may cause: Headache. Fast or irregular heartbeats (palpitations). Shortness of breath. Nosebleed. Nausea and vomiting. Vision changes. Severe chest pain, dizziness, and seizures. How is this diagnosed? This condition is diagnosed by  measuring your blood pressure while you are seated, with your arm resting on a flat surface, your legs uncrossed, and your feet flat on the floor. The cuff of the blood pressure monitor will be placed directly against the skin of your upper arm at the level of your heart. Blood pressure should be measured at least twice using the same arm. Certain conditions can cause a difference in blood pressure between your right and left arms. If you have a high blood pressure reading during one visit or you have normal blood pressure with other risk factors, you may be asked to: Return on a different day to have your blood pressure checked again. Monitor your blood pressure at home for 1 week or longer. If you are diagnosed with hypertension, you may have other blood or imaging tests to help your health care provider understand your overall risk for other conditions. How is this treated? This condition is treated by making healthy lifestyle changes, such as eating healthy foods, exercising more, and reducing your alcohol intake. You may be referred for counseling on a healthy diet and physical activity. Your health care provider may prescribe medicine if lifestyle changes are not enough to get your blood pressure under control and if: Your systolic blood pressure is above 130. Your diastolic blood pressure is above 80. Your personal target blood pressure may vary depending on your medical conditions, your age, and other factors. Follow these instructions at home: Eating and drinking  Eat a diet that is high in fiber and potassium, and low in sodium, added sugar, and fat. An example of this eating plan is called the DASH diet. DASH stands for Dietary Approaches to Stop Hypertension. To eat this way: Eat   plenty of fresh fruits and vegetables. Try to fill one half of your plate at each meal with fruits and vegetables. Eat whole grains, such as whole-wheat pasta, brown rice, or whole-grain bread. Fill about one  fourth of your plate with whole grains. Eat or drink low-fat dairy products, such as skim milk or low-fat yogurt. Avoid fatty cuts of meat, processed or cured meats, and poultry with skin. Fill about one fourth of your plate with lean proteins, such as fish, chicken without skin, beans, eggs, or tofu. Avoid pre-made and processed foods. These tend to be higher in sodium, added sugar, and fat. Reduce your daily sodium intake. Many people with hypertension should eat less than 1,500 mg of sodium a day. Do not drink alcohol if: Your health care provider tells you not to drink. You are pregnant, may be pregnant, or are planning to become pregnant. If you drink alcohol: Limit how much you have to: 0-1 drink a day for women. 0-2 drinks a day for men. Know how much alcohol is in your drink. In the [place] one drink equals one 12 oz bottle of beer (355 mL), one 5 oz glass of wine (148 mL), or one 1 oz glass of hard liquor (44 mL). Lifestyle  Work with your health care provider to maintain a healthy body weight or to lose weight. Ask what an ideal weight is for you. Get at least 30 minutes of exercise that causes your heart to beat faster (aerobic exercise) most days of the week. Activities may include walking, swimming, or biking. Include exercise to strengthen your muscles (resistance exercise), such as Pilates or lifting weights, as part of your weekly exercise routine. Try to do these types of exercises for 30 minutes at least 3 days a week. Do not use any products that contain nicotine or tobacco. These products include cigarettes, chewing tobacco, and vaping devices, such as e-cigarettes. If you need help quitting, ask your health care provider. Monitor your blood pressure at home as told by your health care provider. Keep all follow-up visits. This is important. Medicines Take over-the-counter and prescription medicines only as told by your health care provider. Follow directions carefully. Blood  pressure medicines must be taken as prescribed. Do not skip doses of blood pressure medicine. Doing this puts you at risk for problems and can make the medicine less effective. Ask your health care provider about side effects or reactions to medicines that you should watch for. Contact a health care provider if you: Think you are having a reaction to a medicine you are taking. Have headaches that keep coming back (recurring). Feel dizzy. Have swelling in your ankles. Have trouble with your vision. Get help right away if you: Develop a severe headache or confusion. Have unusual weakness or numbness. Feel faint. Have severe pain in your chest or abdomen. Vomit repeatedly. Have trouble breathing. These symptoms may be an emergency. Get help right away. Call 911. Do not wait to see if the symptoms will go away. Do not drive yourself to the hospital. Summary Hypertension is when the force of blood pumping through your arteries is too strong. If this condition is not controlled, it may put you at risk for serious complications. Your personal target blood pressure may vary depending on your medical conditions, your age, and other factors. For most people, a normal blood pressure is less than 120/80. Hypertension is treated with lifestyle changes, medicines, or a combination of both. Lifestyle changes include losing weight, eating a healthy,   low-sodium diet, exercising more, and limiting alcohol. This information is not intended to replace advice given to you by your health care provider. Make sure you discuss any questions you have with your health care provider. Document Revised: 01/08/2021 Document Reviewed: 01/08/2021 Elsevier Patient Education  2024 [employer]vier Inc.  

## 2023-01-05 NOTE — Progress Notes (Signed)
Subjective:  Patient ID: [name], female    DOB: December 05, 1980  Age: 42 y.o. MRN: [id number]  CC: Hypertension   HPI [name] presents for f/up ----  According to prescription refills she has no longer taking Dyazide.  She saw a gynecologist about a month ago and was prescribed Megace.  She reports persistent menstrual bleeding since then.  She also complains of headache.  Outpatient Medications Prior to Visit  Medication Sig Dispense Refill   clindamycin (CLEOCIN-T) 1 % lotion Apply topically daily. 60 mL 4   levocetirizine (XYZAL) 5 MG tablet Take 1 tablet (5 mg total) by mouth every evening. 90 tablet 0   megestrol (MEGACE) 20 MG tablet Take 1 tablet (20 mg total) by mouth 2 (two) times daily. 60 tablet 2   tranexamic acid (LYSTEDA) 650 MG TABS tablet Take 2 tablets (1,300 mg total) by mouth 3 (three) times daily. Take during menses for a maximum of five days 30 tablet 2   triamcinolone cream (KENALOG) 0.5 % Apply 1 Application topically 3 (three) times daily. 30 g 2   triamterene-hydrochlorothiazide (DYAZIDE) 37.5-25 MG capsule Take 1 each (1 capsule total) by mouth daily. 90 capsule 0   doxycycline (VIBRAMYCIN) 100 MG capsule Take 1 twice a day for 7 days with food when flaring (Patient not taking: Reported on 01/05/2023) 14 capsule 3   No facility-administered medications prior to visit.    ROS Review of Systems  Constitutional:  Negative for appetite change, diaphoresis, fatigue and unexpected weight change.  HENT: Negative.    Eyes: Negative.   Respiratory:  Negative for cough, chest tightness, shortness of breath and wheezing.   Cardiovascular:  Negative for chest pain, palpitations and leg swelling.  Gastrointestinal:  Negative for abdominal pain, constipation, diarrhea, nausea and vomiting.  Endocrine: Negative.   Genitourinary:  Positive for menstrual problem and vaginal bleeding. Negative for difficulty urinating, dysuria, vaginal discharge and  vaginal pain.  Musculoskeletal: Negative.   Skin: Negative.   Neurological:  Negative for dizziness and weakness.  Hematological:  Negative for adenopathy. Does not bruise/bleed easily.  Psychiatric/Behavioral: Negative.      Objective:  BP (!) 144/98   Pulse 86   Temp 98.9 F (37.2 C) (Oral)   Resp 16   Ht 4\' 11"  (1.499 m)   Wt 156 lb 8 oz (71 kg)   LMP  (Exact Date)   SpO2 100%   BMI 31.61 kg/m   BP Readings from Last 3 Encounters:  01/05/23 (!) 144/98  10/29/22 131/87  10/27/22 127/73    Wt Readings from Last 3 Encounters:  01/05/23 156 lb 8 oz (71 kg)  10/29/22 154 lb 4.8 oz (70 kg)  10/06/22 150 lb 12.8 oz (68.4 kg)    Physical Exam Vitals reviewed.  Constitutional:      Appearance: Normal appearance.  HENT:     Mouth/Throat:     Mouth: Mucous membranes are moist.  Eyes:     General: No scleral icterus.    Conjunctiva/sclera: Conjunctivae normal.  Cardiovascular:     Rate and Rhythm: Normal rate and regular rhythm.     Heart sounds: No murmur heard.    No friction rub. No gallop.  Pulmonary:     Effort: Pulmonary effort is normal.     Breath sounds: No stridor. No wheezing, rhonchi or rales.  Abdominal:     General: Abdomen is flat.     Palpations: There is no mass.     Tenderness: There  is no abdominal tenderness. There is no guarding.     Hernia: No hernia is present.  Musculoskeletal:        General: Normal range of motion.     Cervical back: Neck supple.     Right lower leg: No edema.     Left lower leg: No edema.  Lymphadenopathy:     Cervical: No cervical adenopathy.  Skin:    General: Skin is warm and dry.  Neurological:     General: No focal deficit present.     Mental Status: She is alert. Mental status is at baseline.  Psychiatric:        Mood and Affect: Mood normal.        Behavior: Behavior normal.     Lab Results  Component Value Date   WBC 7.4 01/05/2023   HGB 12.4 01/05/2023   HCT 38.3 01/05/2023   PLT 199.0 01/05/2023    GLUCOSE 94 01/05/2023   ALT 16 01/05/2023   AST 18 01/05/2023   NA 142 01/05/2023   K 3.7 01/05/2023   CL 109 01/05/2023   CREATININE 0.73 01/05/2023   BUN 12 01/05/2023   CO2 25 01/05/2023   TSH 0.65 01/05/2023   INR 1.1 (H) 01/05/2023    MM 3D SCREENING MAMMOGRAM BILATERAL BREAST  Result Date: 10/20/2022 CLINICAL DATA:  Screening. EXAM: DIGITAL SCREENING BILATERAL MAMMOGRAM WITH TOMOSYNTHESIS AND CAD TECHNIQUE: Bilateral screening digital craniocaudal and mediolateral oblique mammograms were obtained. Bilateral screening digital breast tomosynthesis was performed. The images were evaluated with computer-aided detection. COMPARISON:  None available. ACR Breast Density Category b: There are scattered areas of fibroglandular density. FINDINGS: There are no findings suspicious for malignancy. IMPRESSION: No mammographic evidence of malignancy. A result letter of this screening mammogram will be mailed directly to the patient. RECOMMENDATION: Screening mammogram in one year. (Code:SM-B-01Y) BI-RADS CATEGORY  1: Negative. Electronically Signed   By: Meda Klinefelter M.D.   On: 10/20/2022 09:34    Assessment & Plan:   Primary hypertension- She has not achieved her blood pressure goal.  Will restart Dyazide. -     Basic metabolic panel; Future -     CBC with Differential/Platelet; Future -     TSH; Future -     Triamterene-HCTZ; Take 1 each (1 capsule total) by mouth daily.  Dispense: 90 capsule; Refill: 0 -     AMB Referral [facility] Care Management -     Hepatic function panel; Future  Metrorrhagia- Labs are negative for complications or causes.  I have sent a message to her gynecologist. -     Basic metabolic panel; Future -     CBC with Differential/Platelet; Future -     TSH; Future -     hCG, quantitative, pregnancy; Future -     Protime-INR; Future -     APTT; Future -     Hepatic function panel; Future     Follow-up: Return in about 3 months (around 04/07/2023).  Sanda Linger, MD

## 2023-01-08 ENCOUNTER — Telehealth: Payer: Self-pay

## 2023-01-08 NOTE — Progress Notes (Signed)
   Care Guide Note  01/08/2023 Name: [name] MRN: [id number] DOB: 1980/04/17  Referred by: Etta Grandchild, MD Reason for referral : Care Coordination (Outreach to schedule with Pharm d )   [name] is a 42 y.o. year old female who is a primary care patient of Etta Grandchild, MD. Mateo Flow was referred to the pharmacist for assistance related to HTN.    Successful contact was made with the patient to discuss pharmacy services including being ready for the pharmacist to call at least 5 minutes before the scheduled appointment time, to have medication bottles and any blood sugar or blood pressure readings ready for review. The patient agreed to meet with the pharmacist via with the pharmacist via telephone visit on (date/time).  01/16/2023  Penne Lash, RMA Care Guide [facility]  [place], [place] [address] Direct Dial: [phone] [email or web address]

## 2023-01-15 ENCOUNTER — Ambulatory Visit (INDEPENDENT_AMBULATORY_CARE_PROVIDER_SITE_OTHER): Payer: 59 | Admitting: Obstetrics and Gynecology

## 2023-01-15 ENCOUNTER — Other Ambulatory Visit: Payer: Self-pay

## 2023-01-15 VITALS — BP 144/96 | HR 76 | Wt 158.4 lb

## 2023-01-15 DIAGNOSIS — Z1330 Encounter for screening examination for mental health and behavioral disorders, unspecified: Secondary | ICD-10-CM

## 2023-01-15 DIAGNOSIS — N939 Abnormal uterine and vaginal bleeding, unspecified: Secondary | ICD-10-CM

## 2023-01-15 DIAGNOSIS — R102 Pelvic and perineal pain: Secondary | ICD-10-CM

## 2023-01-15 NOTE — Progress Notes (Addendum)
GYNECOLOGY VISIT  Patient name: [name] MRN [id number]  Date of birth: 04-Feb-1981 Chief Complaint:   Gynecologic Exam and AUB follow up   History:  [name] is a 42 y.o. [phone] being seen today for AUB follow up.     Discussed the use of AI scribe software for clinical note transcription with the patient, who gave verbal consent to proceed.  History of Present Illness   The patient, who has been prescribed medication by Dr. Madie Reno, reports taking the medication once daily, specifically when experiencing episodes of bleeding. The patient was unaware that the medication was intended to be taken twice daily. The patient expresses difficulty with pill-taking, but is able to manage small pills. The patient is also on blood pressure medication and allergy pills, but admits to not taking them consistently.  The patient has been experiencing new-onset pain during orgasm, which they describe as a dry sensation. This symptom has been present since the patient started their current medication regimen. The patient also reports a history of vaginal dryness, which they believe may be related to the medication. The patient has stopped sexual activity due to these symptoms.  The patient also expresses concern about potential perimenopausal symptoms, although they are unsure if this is the cause of their symptoms. The patient is open to undergoing an exam and possibly an endometrial biopsy to further investigate their symptoms.       Past Medical History:  Diagnosis Date   Dysplasia of cervix, low grade (CIN 1) 11/12/2020   Colpo CIN 1 6/22  Cryo 8/22     No pertinent past medical history     Past Surgical History:  Procedure Laterality Date   CESAREAN SECTION  2004   DILATION AND CURETTAGE OF UTERUS  2007   TUBAL LIGATION  2008    The following portions of the patient's history were reviewed and updated as appropriate: allergies, current medications, past family  history, past medical history, past social history, past surgical history and problem list.   Health Maintenance:   Last pap     Component Value Date/Time   DIAGPAP (A) 10/06/2022 1409    - Atypical squamous cells of undetermined significance (ASC-US)   DIAGPAP - Low grade squamous intraepithelial lesion (LSIL) (A) 07/13/2020 0959   HPVHIGH Negative 10/06/2022 1409   HPVHIGH Positive (A) 07/13/2020 0959   ADEQPAP  10/06/2022 1409    Satisfactory for evaluation; transformation zone component PRESENT.   ADEQPAP  07/13/2020 0959    Satisfactory for evaluation; transformation zone component PRESENT.    High Risk HPV: Positive  Adequacy:  Satisfactory for evaluation, transformation zone component PRESENT  Diagnosis:  Atypical squamous cells of undetermined significance (ASC-US)  Last mammogram: 10/2022 BIRADS 1   Review of Systems:  Pertinent items are noted in HPI. Comprehensive review of systems was otherwise negative.   Objective:  Physical Exam BP (!) 144/96   Pulse 76   Wt 158 lb 6.4 oz (71.8 kg)   LMP 01/12/2023   BMI 31.99 kg/m    Physical Exam Vitals and nursing note reviewed. Exam conducted with a chaperone present.  Constitutional:      Appearance: Normal appearance.  HENT:     Head: Normocephalic and atraumatic.  Pulmonary:     Effort: Pulmonary effort is normal.     Breath sounds: Normal breath sounds.  Abdominal:     Comments: + carnett  Genitourinary:    General: Normal vulva.     Exam  position: Lithotomy position.     Vagina: Normal.     Cervix: Normal.     Comments: Normal appearing vulva Normal vulvar sensation bilaterally Tender superficial pelvic floor muscles Nontender ischial tuberosities bilaterally  Anal wink not present Posterior vaginal wall tender Right levator ani 5/10 Right ischiococcygeous 5/10 Right obturator internus 10/10 Left levator ani 8/10 Left ischioccocygeous 8/10 Left obturator internus 9/10 Uterine  nontender   Skin:    General: Skin is warm and dry.  Neurological:     General: No focal deficit present.     Mental Status: She is alert.  Psychiatric:        Mood and Affect: Mood normal.        Behavior: Behavior normal.        Thought Content: Thought content normal.        Judgment: Judgment normal.        Assessment & Plan:   1. Abnormal uterine bleeding 2. Pelvic pain  Assessment and Plan    Abnormal Uterine Bleeding They have been inconsistently taking Megace, highlighting the need for consistent dosing and exploring other hormonal control or contraception options if bleeding persists. We will advise them to use Megace twice daily and consider alternative hormonal control methods if there is no improvement.  Dyspareunia They report new onset pain with orgasm and vaginal dryness, suggesting perimenopausal symptoms or a yeast infection from doxycycline. A pelvic exam was performed today to check for uterine or pelvic floor muscle tenderness. Pelvic floor physical therapy referral placed given pelvic floor tenderness on exam, and an endometrial biopsy may be necessary if bleeding does not improve with consistent Megace use. Additionally, noted that there may be an anxiety/mental aspect. If there is an issue in the relationship or fear of pain with intercourse, there may be physiologic response resulting in vaginal dryness.   Hypertension They admit to inconsistent use of their blood pressure medication. We will encourage them to take their blood pressure medication consistently.       Routine preventative health maintenance measures emphasized.  Lorriane Shire, MD [facility], [facility]

## 2023-01-16 ENCOUNTER — Other Ambulatory Visit: Payer: 59

## 2023-01-27 ENCOUNTER — Other Ambulatory Visit: Payer: 59

## 2023-01-27 NOTE — Progress Notes (Signed)
01/27/2023 Name: EZOLA GAJDA MRN: [id number] DOB: [birth year]  No chief complaint on file.   SANIAYA FLOREK is a 42 y.o. year old female who presented for a telephone visit.   They were referred to the pharmacist by their PCP for assistance in managing hypertension.    Subjective:  Initially patient did not answer and I left a VM with call back number. She returned my call, but was not at home and did not have access to her medication bottles. She was unsure of the names of her current medications, but stated she would be home in 15 minutes. I called her back once she was home and we were able to confirm her medication list with her medication bottles.  Care Team: Primary Care Provider: Etta Grandchild, MD ; Next Scheduled Visit: 04/06/23  Medication Access/Adherence  Current Pharmacy:  [facility] [address] [place] - [address] [place] [address] Phone: [phone] Fax: [phone]  [id number] [place], [place] - [address] AT [facility] [address] [place] [address] Phone: [phone] Fax: [phone]   Patient reports affordability concerns with their medications: No  Patient reports access/transportation concerns to their pharmacy: No  Patient reports adherence concerns with their medications:  Yes - previously admitted to difficulty remembering to take medications every day, but she recently she has been trying to be better about this and adherence has improved.   Hypertension:  Current medications: amlodipine 10 mg daily  Medications previously tried: triamterene-hydrochlorothiazide 37.5-25 mg daily  Patient reports adherence to amlodipine. Reports she was not taking BP medications before most recent office visit, but now is taking amlodipine every morning at 9 AM. She is not taking triamterene-hydrochlorothiazide. Notes that she had a bad reaction to a  previous BP medication. States it made her feel like a zombie, but could not recall which medication this was. Of note, she is moving soon and has had increased stress at home which she thinks is contributing to her increased BP and headaches.  Patient has a validated, automated, upper arm home BP cuff. Current blood pressure readings readings: has not checked since most recent office visits  Patient reports hypotensive s/sx including dizziness, lightheadedness rarely. Notes she will have lightheadedness/dizziness maybe 2 times per month when she needs to eat something. Denies passing out or falling and sx resolve with rest.  Patient reports hypertensive symptoms including headache. Headache occurs about twice a month and lasts for about 3 days. Initially denied chest pain, then stated she will rarely have a sharp pain in her chest and will take some deep breaths and pain resolves. Reports this happens randomly could be at rest or when moving around. Denies palpitations and SOB.  Current behavioral patterns:  Does add salt to food, has tried to substitute with Ms. Dash Drinks grape soda, no coffee, knows she needs to drink more water Smokes 2 Black and Mild cigars per day  Current physical activity: Not currently exercising, used to go to the gym, but now that daughter is working she doesn't have time. 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   Objective:  No results found for: "HGBA1C"  Lab Results  Component Value Date   CREATININE 0.73 01/05/2023   BUN 12 01/05/2023   NA 142 01/05/2023   K 3.7 01/05/2023   CL 109 01/05/2023   CO2 25 01/05/2023    No  results found for: "CHOL", "HDL", "LDLCALC", "LDLDIRECT", "TRIG", "CHOLHDL"  Medications Reviewed Today   Medications were not reviewed in this encounter       Assessment/Plan:   Hypertension: - Currently uncontrolled based on readings from most recent office visit and unable to assess control since restarting  amlodipine since she has not been checking at home - Reviewed long term cardiovascular and renal outcomes of uncontrolled blood pressure - Reviewed appropriate blood pressure monitoring technique and reviewed goal blood pressure. Recommended to check home blood pressure and heart rate at least once a week. - Recommend to increase aerobic exercise to 150 minutes weekly. Discussed starting off small with the goal of going for a walk 1-2 days per week. - Recommend to continue amlodipine 10 mg daily  - Given she was not taking any BP medications prior to most recent office visit when BP was elevated and now has restarted amlodipine 10 mg daily, recommended to monitor BP at home and then assess need for additional BP medications.   Follow Up Plan: phone visit in 2 weeks  Jarrett Ables, PharmD PGY-1 Pharmacy Resident

## 2023-02-02 ENCOUNTER — Ambulatory Visit: Payer: 59 | Admitting: Dermatology

## 2023-02-06 ENCOUNTER — Encounter: Payer: Self-pay | Admitting: Internal Medicine

## 2023-02-10 ENCOUNTER — Other Ambulatory Visit: Payer: Self-pay

## 2023-02-10 NOTE — Progress Notes (Signed)
Contacted patient for scheduled appointment for medication management. She forgot about the appointment and was out preparing for Thanksgiving. We rescheduled the visit. Encouraged her to schedule visit with her PCP for feet itching and swelling and she said she would call tomorrow. This will also be helpful to have an in office blood pressure reading now that she is back on amlodipine. Encouraged her to check BP at home as well.  Jarrett Ables, PharmD PGY-1 Pharmacy Resident

## 2023-03-05 ENCOUNTER — Encounter: Payer: Self-pay | Admitting: Obstetrics and Gynecology

## 2023-03-09 ENCOUNTER — Other Ambulatory Visit: Payer: Self-pay | Admitting: Obstetrics and Gynecology

## 2023-03-09 DIAGNOSIS — N939 Abnormal uterine and vaginal bleeding, unspecified: Secondary | ICD-10-CM

## 2023-03-09 MED ORDER — MEGESTROL ACETATE 20 MG PO TABS: 20.0000 mg | ORAL_TABLET | Freq: Two times a day (BID) | ORAL | 2 refills | Status: DC

## 2023-03-15 ENCOUNTER — Other Ambulatory Visit: Payer: Self-pay

## 2023-03-15 NOTE — Progress Notes (Signed)
03/15/2023 Name: [name] MRN: [id number] DOB: Jul 17, 1980  No chief complaint on file.   [name] is a 42 y.o. year old female who presented for a telephone visit.   They were referred to the pharmacist by their PCP for assistance in managing hypertension.    Subjective: Patient reports she is feeling okay today. Reports she was at work yesterday when she started to have muscle aches, sore throat, and cough and she has been at home resting.  Care Team: Primary Care Provider: Etta Grandchild, MD ; Next Scheduled Visit: 04/06/23  Medication Access/Adherence  Current Pharmacy:  [facility] [address], [place] - [address] [place] [address] Phone: [phone] Fax: [phone]  [id number] - [place], [place] - [address] AT [facility] [address] [place] [address] Phone: [phone] Fax: [phone]   Patient reports affordability concerns with their medications: No  Patient reports access/transportation concerns to their pharmacy: No  Patient reports adherence concerns with their medications:  No     Hypertension:  Current medications: amlodipine 10 mg daily Medications previously tried: triamterene-hydrochlorothiazide 37.5-25 mg daily   Patient has an automated, upper arm home BP cuff. Current blood pressure readings readings: checked BP on 12/20 with reading of 122/88  LE swelling has improved recently. She attributes swelling to the days when she is standing for long periods on her feet working at [employer]. Swelling improves with elevation. She reports improved adherence to amlodipine. Denies missed doses in the past 2 weeks.  Patient denies hypotensive s/sx including dizziness, lightheadedness.  Patient denies hypertensive symptoms including headache, chest pain, shortness of breath.   Current meal patterns:  - has stopped adding salt to food,  substitutes with Mrs. Dash  Current physical activity: On her feet a lot working at [employer], has started exercising with her kids doing youtube exercise videos for 20-30 minutes at a time 2-3x per week (squats, running in place, sit ups etc.)   Objective:  No results found for: "HGBA1C"  Lab Results  Component Value Date   CREATININE 0.73 01/05/2023   BUN 12 01/05/2023   NA 142 01/05/2023   K 3.7 01/05/2023   CL 109 01/05/2023   CO2 25 01/05/2023    No results found for: "CHOL", "HDL", "LDLCALC", "LDLDIRECT", "TRIG", "CHOLHDL"  Medications Reviewed Today   Medications were not reviewed in this encounter       Assessment/Plan:   Hypertension: - Currently uncontrolled, but close to goal based on home BP reading 122/88 last week. Of note, home BP cuff has not been validated with clinic BP cuff, but she reports improved adherence to amlodipine and reduction in salt intake which correlates with improved BP reading.  - Reviewed long term cardiovascular and renal outcomes of uncontrolled blood pressure - Reviewed appropriate blood pressure monitoring technique and reviewed goal blood pressure. Recommended to check home blood pressure and heart rate at least once a week. Counseled her to record readings and bring them to upcoming PCP visit. - Counseled her to bring home BP cuff to next PCP visit for validation with office BP machine. - Commended her for starting an exercise routine with her kids and encouraged her to continue doing this with goal of 150 minutes per week. - Recommend to continue amlodipine 10 mg daily.    Follow Up Plan: PCP visit on 04/06/23 and pharmacist telephone visit in 2 months  Adon Gehlhausen  Noe Gens, PharmD PGY-1 Pharmacy Resident

## 2023-04-06 ENCOUNTER — Ambulatory Visit: Payer: 59 | Admitting: Internal Medicine

## 2023-04-10 NOTE — Therapy (Signed)
OUTPATIENT PHYSICAL THERAPY FEMALE PELVIC EVALUATION   Patient Name: [name] MRN: [id number] DOB:Aug 02, 1980, 43 y.o., female Today's Date: 04/13/2023  END OF SESSION:  PT End of Session - 04/13/23 0958     Visit Number 1    Authorization Type [employer] [facility]    Authorization Time Period 04/13/2023-10/04/2023    PT Start Time 0800    PT Stop Time 0850    PT Time Calculation (min) 50 min    Activity Tolerance Patient tolerated treatment well    Behavior During Therapy [facility] for tasks assessed/performed             Past Medical History:  Diagnosis Date   Dysplasia of cervix, low grade (CIN 1) 11/12/2020   Colpo CIN 1 6/22  Cryo 8/22     No pertinent past medical history    Past Surgical History:  Procedure Laterality Date   CESAREAN SECTION  2004   DILATION AND CURETTAGE OF UTERUS  2007   TUBAL LIGATION  2008   Patient Active Problem List   Diagnosis Date Noted   Metrorrhagia 01/05/2023   Menorrhagia with regular cycle 10/29/2022   ASCUS of cervix with negative high risk HPV 10/09/2022   Visit for routine gyn exam 10/06/2022   Pelvic pain 10/06/2022   STD exposure 10/06/2022   Allergic contact dermatitis due to adhesives 08/07/2022   EIC (epidermal inclusion cyst) 07/07/2022   Visit for screening mammogram 07/07/2022   Migraine without aura and without status migrainosus, not intractable 07/22/2021   Primary hypertension 06/06/2021    PCP: Etta Grandchild, MD PCP - General  REFERRING PROVIDER: Lorriane Shire, MD Ref Provider Pelvic therapy - dyspareunia   REFERRING DIAG: R10.2 (ICD-10-CM) - Pelvic pain   THERAPY DIAG:  Left lower quadrant abdominal pain  Pelvic pain  Other muscle spasm  Other lack of coordination  Rationale for Evaluation and Treatment: Rehabilitation  ONSET DATE: 6 months ago  SUBJECTIVE:                                                                                                                                                                                            SUBJECTIVE STATEMENT: Pt reports that she has had both pelvic pain and pain with intercourse for 3 months. She has pain with orgasm. Happened 3 times.  Pt has had heavy periods, was bleeding for 2 months straight, her doctor put her on some pills.  Pt had been bleeding for 3 weeks.  Pt reports that she stopped the medicine in December because she stopped bleeding. Yesterday she felt camps.  Pain with orgasm is better  now.  Fluid intake: Yes: soda and juice- feels like it could be that  when she holds her urine at works, she is busy               Pt has to wear pads, tampons not enough.  PAIN:  Are you having pain? 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[employer]  PLOF: Independent  PATIENT GOALS: not to have pain and figure out why  PERTINENT HISTORY:  Bleeding and pain with periods and bloating, diarrhea, constipation Sexual abuse: to be asked  BOWEL MOVEMENT: Pain with bowel movement: No Type of bowel movement:Type (Bristol Stool Scale) 1-7 Fully empty rectum: No Leakage: No Pads: No Fiber supplement: No  URINATION: Pain with urination: No Fully empty bladder: Yes:   Stream: Strong Urgency: Yes: when she waits too long at work Frequency: no Leakage: Urge to void, Walking to the bathroom, Coughing, Sneezing, Laughing, and Exercise Pads: Yes:    INTERCOURSE: Pain with intercourse: Initial Penetration, During  Penetration, After Intercourse, and During Climax Ability to have vaginal penetration:  Yes:   Climax: yes Marinoff Scale: 1/3  PREGNANCY: Vaginal deliveries 2 Tearing No C-section deliveries 1 Currently pregnant No  PROLAPSE: None   OBJECTIVE:  Note: Objective measures were completed at Evaluation unless otherwise noted.  DIAGNOSTIC FINDINGS:  Normal ultrasound  COGNITION: Overall cognitive status: Within functional limits for tasks assessed     SENSATION: Light touch: Appears intact Proprioception: Appears intact  MUSCLE LENGTH: Hamstrings: Right 80 deg; Left 80 deg   LUMBAR SPECIAL TESTS:  Straight leg raise test: Negative     POSTURE: increased lumbar lordosis  PELVIC ALIGNMENT: seems even  LUMBARAROM/PROM: seems normal   LOWER EXTREMITY ROM: some tightness bilateral hips   LOWER EXTREMITY MMT: seems within functional limitations  PALPATION:   General  upper chest breathing, abdominal bloating, pain throughout with palpation. More LLQ, some tenderness C section scar                 External Perineal Exam to be assessed                             Internal Pelvic Floor to be assessed  Patient confirms identification and approves PT to assess internal pelvic floor and treatment Yes  PELVIC MMT:   MMT eval  Vaginal   Internal Anal Sphincter   External Anal Sphincter   Puborectalis   Diastasis Recti   (Blank rows = not tested)        TONE: To be assessed  PROLAPSE: To be assessed  TODAY'S TREATMENT:  DATE: 04/13/23   EVAL see below   PATIENT EDUCATION/ there acts:  Education details: relevant anatomy, exam findings, HEP Person educated: Patient Education method: Explanation and Handouts Education comprehension: verbalized understanding and needs further education  HOME EXERCISE  PROGRAM: [id number]   ASSESSMENT:  CLINICAL IMPRESSION: Patient is a 43 y.o. F who was seen today for physical therapy evaluation and treatment for dyspareunia. She seems to have some bladder pain as well as abdominal pain and will benefit from pelvic PT. Hx of heavy periods with heavy bleeding. Pt has bilateral hip tightness and abdominal pain. Discussed ultrasound results, reviewed anatomy and bladder irritants as well as healthy bladder PT recommendations. Pt awaiting endometrial biopsy.  OBJECTIVE IMPAIRMENTS: decreased ROM, increased muscle spasms, and pain.   ACTIVITY LIMITATIONS: lifting, bending, continence, and toileting  PARTICIPATION LIMITATIONS: occupation  PERSONAL FACTORS: Education are also affecting patient's functional outcome.   REHAB POTENTIAL: Good  CLINICAL DECISION MAKING: Stable/uncomplicated  EVALUATION COMPLEXITY: Low   GOALS: Goals reviewed with patient? Yes  SHORT TERM GOALS: Target date: 05/11/2023    Pt will be I with healthy bladder recommendations Baseline: Goal status: INITIAL  2.  Pt will reduce bladder irritants Baseline:  Goal status: INITIAL  3.  Pt will be I with abdominal massage Baseline:  Goal status: INITIAL  4.  Pt will be I with her initial HEP Baseline:  Goal status: INITIAL   LONG TERM GOALS: Target date: 10/04/2023  Pt will report max 2/10 abdominal pain Baseline:  Goal status: INITIAL  2.  Pt will report max 2/10 pain with intercourse Baseline:  Goal status: INITIAL  3.  Pt will be  I advanced HEP Baseline:  Goal status: INITIAL  4.  Pt will soak 0 pads/ day Baseline:  Goal status: INITIAL    PLAN:  PT FREQUENCY: 1-2x/week  PT DURATION: 6 months  PLANNED INTERVENTIONS: 97110-Therapeutic exercises, 97530- Therapeutic activity, 97112- Neuromuscular re-education, 97535- Self Care, 08657- Manual therapy, [phone]- Electrical stimulation (manual), Dry Needling, Joint mobilization, Joint manipulation, Spinal  manipulation, Spinal mobilization, Scar mobilization, Moist heat, and Biofeedback  PLAN FOR NEXT SESSION: internal pelvic floor muscle assessmenr  Tradarius Reinwald, PT 04/13/23 9:59 AM

## 2023-04-13 ENCOUNTER — Other Ambulatory Visit: Payer: Self-pay

## 2023-04-13 ENCOUNTER — Encounter: Payer: Self-pay | Admitting: Physical Therapy

## 2023-04-13 ENCOUNTER — Ambulatory Visit: Payer: 59 | Attending: Obstetrics and Gynecology | Admitting: Physical Therapy

## 2023-04-13 DIAGNOSIS — R102 Pelvic and perineal pain: Secondary | ICD-10-CM | POA: Insufficient documentation

## 2023-04-13 DIAGNOSIS — R1032 Left lower quadrant pain: Secondary | ICD-10-CM | POA: Insufficient documentation

## 2023-04-13 DIAGNOSIS — M62838 Other muscle spasm: Secondary | ICD-10-CM | POA: Insufficient documentation

## 2023-04-13 DIAGNOSIS — R278 Other lack of coordination: Secondary | ICD-10-CM | POA: Insufficient documentation

## 2023-04-20 ENCOUNTER — Ambulatory Visit: Payer: 59 | Attending: Obstetrics and Gynecology | Admitting: Physical Therapy

## 2023-04-20 ENCOUNTER — Encounter: Payer: Self-pay | Admitting: Physical Therapy

## 2023-04-20 DIAGNOSIS — R1032 Left lower quadrant pain: Secondary | ICD-10-CM | POA: Insufficient documentation

## 2023-04-20 DIAGNOSIS — R278 Other lack of coordination: Secondary | ICD-10-CM | POA: Insufficient documentation

## 2023-04-20 DIAGNOSIS — R102 Pelvic and perineal pain: Secondary | ICD-10-CM | POA: Diagnosis not present

## 2023-04-20 DIAGNOSIS — M62838 Other muscle spasm: Secondary | ICD-10-CM | POA: Insufficient documentation
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
## 2023-04-27 ENCOUNTER — Ambulatory Visit: Payer: 59 | Admitting: Physical Therapy

## 2023-04-27 ENCOUNTER — Encounter: Payer: Self-pay | Admitting: Physical Therapy

## 2023-04-27 DIAGNOSIS — M62838 Other muscle spasm: Secondary | ICD-10-CM | POA: Diagnosis not present

## 2023-04-27 DIAGNOSIS — R278 Other lack of coordination: Secondary | ICD-10-CM

## 2023-04-27 DIAGNOSIS — R102 Pelvic and perineal pain: Secondary | ICD-10-CM

## 2023-04-27 DIAGNOSIS — R1032 Left lower quadrant pain: Secondary | ICD-10-CM | POA: Diagnosis not present
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
## 2023-05-03 ENCOUNTER — Other Ambulatory Visit: Payer: Self-pay

## 2023-05-03 NOTE — Progress Notes (Signed)
05/03/2023 Name: [name] MRN: [id number] DOB: 03-11-1981  Chief Complaint  Patient presents with   Hypertension    [name] is a 43 y.o. year old female who presented for a telephone visit.   They were referred to the pharmacist by their PCP for assistance in managing hypertension. PMH includes HTN, migraine without aura, tobacco use   Subjective: Patient reports she is feeling okay today. Her PCP appt on 04/06/23 was rescheduled for 05/13/23.    Care Team: Primary Care Provider: Etta Grandchild, MD ; Next Scheduled Visit: 05/13/23  Medication Access/Adherence  Current Pharmacy:  [facility] [address], [place] - [address] [place] [address] Phone: [phone] Fax: [phone]  [id number] - [place], [place] - [address] AT [facility] [address] [place] [address] Phone: [phone] Fax: [phone]   Patient reports affordability concerns with their medications: No  Patient reports access/transportation concerns to their pharmacy: No  Patient reports adherence concerns with their medications:  No   - dispense hx does not appear to be accurate.  Reports that she cannot swallow large pills - if we need to add on a medication, she would prefer that it is smaller than the amlodipine she is currently taking.  Hypertension:  Current medications: amlodipine 10 mg daily (~8:30-9AM)  Medications previously tried: triamterene-hydrochlorothiazide 37.5-25 mg daily   Tobacco use: has 1 black and mild/day  Patient has an automated, upper arm home BP cuff. Either checks in the morning when she gets up, or late at night.  Current blood pressure readings readings: Last checked BP a couple days ago - had a headache: 140s/99. Otherwise, she states BP have been "normal." On 04/19/23: 122/88. She does not have additional BP readings she is able to review with me today.    Checked BP during telephone visit after sitting for 5 mins:  127/96, pulse 84  LE swelling has improved - this is not happening everyday.  She attributes swelling to the days when she is standing for long periods on her feet working at [employer]. Swelling improves with elevation.   Patient denies hypotensive s/sx including dizziness, lightheadedness.  Patient denies hypertensive symptoms including chest pain, shortness of breath. Reports occasional headache if she is hungry or stressed.   Current meal patterns:  - has stopped adding salt to food, substitutes with [employer] - Denies caffeine intake - Drinks: orange juice, sprite   Current physical activity: On her feet a lot working at [employer]. She has been going to pelvic floor therapy - has been doing pelvic floor exercise. She was previously exercising with her kids doing youtube exercise videos for 20-30 minutes at a time 2-3x per week (squats, running in place, sit ups etc.), but is not currently engaging in this activity.    Objective:  No results found for: "HGBA1C"  BP Readings from Last 3 Encounters:  05/03/23 (!) 127/96  01/15/23 (!) 144/96  01/05/23 (!) 144/98    Lab Results  Component Value Date   CREATININE 0.73 01/05/2023   BUN 12 01/05/2023   NA 142 01/05/2023   K 3.7 01/05/2023   CL 109 01/05/2023   CO2 25 01/05/2023    No results found for: "CHOL", "HDL", "LDLCALC", "LDLDIRECT", "TRIG", "CHOLHDL"  Clinical ASCVD: No  The ASCVD Risk score (Arnett DK, et al., 2019) failed to calculate for the following reasons:   Cannot find a  previous HDL lab   Cannot find a previous total cholesterol lab    Medications Reviewed Today     Reviewed by Particia Lather, RPH (Pharmacist) on 05/03/23 at 1246  Med List Status: <None>   Medication Order Taking? Sig Documenting Provider Last Dose Status Informant  amLODipine (NORVASC) 10 MG tablet [id number] Yes Take 10 mg by mouth daily. [provider] Taking  Active   clindamycin (CLEOCIN-T) 1 % lotion [id number]  Apply topically daily. Terri Piedra, DO  Active   levocetirizine (XYZAL) 5 MG tablet [id number] Yes Take 1 tablet (5 mg total) by mouth every evening. Etta Grandchild, MD Taking Active            Med Note Alycia Patten May 03, 2023 12:46 PM) Taking PRN  megestrol (MEGACE) 20 MG tablet [id number]  Take 1 tablet (20 mg total) by mouth 2 (two) times daily. Lorriane Shire, MD  Active   tranexamic acid (LYSTEDA) 650 MG TABS tablet [id number] No Take 2 tablets (1,300 mg total) by mouth 3 (three) times daily. Take during menses for a maximum of five days  Patient not taking: Reported on 01/15/2023   Hermina Staggers, MD Not Taking Active            Med Note Alycia Patten May 03, 2023 12:45 PM) Did not think it was helping - reports bleeding is now regular  triamcinolone cream (KENALOG) 0.5 % [id number]  Apply 1 Application topically 3 (three) times daily. Etta Grandchild, MD  Active            Med Note Noe Gens, [place]   Tue Jan 27, 2023  7:12 PM) Taking daily              Assessment/Plan:   Hypertension: - Currently uncontrolled above goal <130/80 mmHg, with persistently elevated DBP, though improved from baseline. Patient reports adherence to CCB and has appropriate home BP monitoring technique. Of note - pt states she is taking amlodipine but this appears to have been d/c by provider in Apr 2024. Pharmacy was closed today so was not able to investigate fill hx. Assuming pt is taking amlodipine, discussed with patient that she likely will require an additional medication to control her BP. Patient does not have a compelling indication for ARB, but potassium and kidney function were stable and WNL on last BMP in Oct 2023. Recommend obtaining baseline UACR.  - Reviewed long term cardiovascular and renal outcomes of uncontrolled blood pressure - Discussed that smoking cessation and increased physical activity may reduce her  need for additional medication to lower BP - Reviewed appropriate blood pressure monitoring technique and reviewed goal blood pressure. Recommended to check home blood pressure and heart rate at least once a week. Recommend that patient rest for at least 5 min prior to checking BP. Check BP 1-2 hours after taking medication, and do not take BP in close proximity to caffeine or tobacco use. Counseled her to record readings and bring them to upcoming PCP visit. - Counseled her to bring home BP cuff to next PCP visit for validation with office BP machine. -Encouraged patient to resume physical activity and aim for 150 minutes of exercise per week. - Recommend to continue amlodipine 10 mg daily.  - Recommend to start olmesartan 20 mg daily - will discuss addition of ARB to regimen with PCP - Patient is due for UACR and lipid panel at upcoming PCP appt  Follow Up Plan: PCP visit on 05/13/23, pharmacist 06/07/23  Nils Pyle, PharmD PGY1 Pharmacy Resident

## 2023-05-04 ENCOUNTER — Encounter: Payer: Self-pay | Admitting: Physical Therapy

## 2023-05-04 ENCOUNTER — Ambulatory Visit: Payer: 59 | Admitting: Physical Therapy

## 2023-05-04 DIAGNOSIS — R102 Pelvic and perineal pain: Secondary | ICD-10-CM | POA: Diagnosis not present

## 2023-05-04 DIAGNOSIS — R278 Other lack of coordination: Secondary | ICD-10-CM | POA: Diagnosis not present

## 2023-05-04 DIAGNOSIS — M62838 Other muscle spasm: Secondary | ICD-10-CM | POA: Diagnosis not present

## 2023-05-04 DIAGNOSIS — R1032 Left lower quadrant pain: Secondary | ICD-10-CM
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
## 2023-05-11 ENCOUNTER — Ambulatory Visit: Payer: 59 | Admitting: Physical Therapy

## 2023-05-11 ENCOUNTER — Encounter: Payer: Self-pay | Admitting: Physical Therapy

## 2023-05-11 DIAGNOSIS — R278 Other lack of coordination: Secondary | ICD-10-CM | POA: Diagnosis not present

## 2023-05-11 DIAGNOSIS — R1032 Left lower quadrant pain: Secondary | ICD-10-CM | POA: Diagnosis not present

## 2023-05-11 DIAGNOSIS — M62838 Other muscle spasm: Secondary | ICD-10-CM | POA: Diagnosis not present

## 2023-05-11 DIAGNOSIS — R102 Pelvic and perineal pain: Secondary | ICD-10-CM | POA: Diagnosis not present
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
## 2023-05-13 ENCOUNTER — Encounter: Payer: Self-pay | Admitting: Internal Medicine

## 2023-05-13 ENCOUNTER — Ambulatory Visit (INDEPENDENT_AMBULATORY_CARE_PROVIDER_SITE_OTHER): Payer: 59 | Admitting: Internal Medicine

## 2023-05-13 VITALS — BP 128/82 | HR 95 | Temp 98.8°F | Resp 16 | Ht 59.0 in | Wt 160.2 lb

## 2023-05-13 DIAGNOSIS — L509 Urticaria, unspecified: Secondary | ICD-10-CM

## 2023-05-13 DIAGNOSIS — I10 Essential (primary) hypertension: Secondary | ICD-10-CM

## 2023-05-13 LAB — BASIC METABOLIC PANEL
BUN: 12 mg/dL (ref 6–23)
CO2: 26 meq/L (ref 19–32)
Calcium: 9.1 mg/dL (ref 8.4–10.5)
Chloride: 109 meq/L (ref 96–112)
Creatinine, Ser: 0.7 mg/dL (ref 0.40–1.20)
GFR: 107.09 mL/min (ref >60.00)
Glucose, Bld: 96 mg/dL (ref 70–99)
Potassium: 3.9 meq/L (ref 3.5–5.1)
Sodium: 140 meq/L (ref 135–145)

## 2023-05-13 LAB — CBC WITH DIFFERENTIAL/PLATELET
Basophils Absolute: 0.1 10*3/uL (ref 0.0–0.1)
Basophils Relative: 0.8 % (ref 0.0–3.0)
Eosinophils Absolute: 0.2 10*3/uL (ref 0.0–0.7)
Eosinophils Relative: 2.8 % (ref 0.0–5.0)
HCT: 37.5 % (ref 36.0–46.0)
Hemoglobin: 12.4 g/dL (ref 12.0–15.0)
Lymphocytes Relative: 38.2 % (ref 12.0–46.0)
Lymphs Abs: 2.3 10*3/uL (ref 0.7–4.0)
MCHC: 33 g/dL (ref 30.0–36.0)
MCV: 96 fL (ref 78.0–100.0)
Monocytes Absolute: 0.7 10*3/uL (ref 0.1–1.0)
Monocytes Relative: 11.8 % (ref 3.0–12.0)
Neutro Abs: 2.8 10*3/uL (ref 1.4–7.7)
Neutrophils Relative %: 46.4 % (ref 43.0–77.0)
Platelets: 180 10*3/uL (ref 150.0–400.0)
RBC: 3.91 Mil/uL (ref 3.87–5.11)
RDW: 13.4 % (ref 11.5–15.5)
WBC: 6.1 10*3/uL (ref 4.0–10.5)

## 2023-05-13 LAB — C-REACTIVE PROTEIN: CRP: <1 mg/dL (ref 0.5–20.0)

## 2023-05-13 MED ORDER — MONTELUKAST SODIUM 10 MG PO TABS: 10.0000 mg | ORAL_TABLET | Freq: Every day | ORAL | 0 refills | Status: DC

## 2023-05-13 NOTE — Progress Notes (Unsigned)
 Subjective:  Patient ID: [name], female    DOB: 06/26/1980  Age: 43 y.o. MRN: [id number]  CC: Hypertension   HPI [name] presents for f/up ----  Discussed the use of AI scribe software for clinical note transcription with the patient, who gave verbal consent to proceed.  History of Present Illness   [name] is a 43 year old female with hypertension who presents for blood pressure management and concerns about a rash.  Her blood pressure is well controlled with regular monitoring by a nurse every other Sunday. She experiences no dizziness, lightheadedness, or swelling in her legs or feet. She notes occasional tightness, warmth, and itching in her feet. She is currently taking amlodipine, though the dosage and frequency were not specified.  She is concerned that the cream and allergy pills prescribed for her rash are not effective. The rash causes itching, particularly between her legs, although it is not present at the time of the visit.  She experiences tiredness during exercise but denies any chest pain or shortness of breath. She feels generally good and confirms there is no chance of pregnancy as her tubes are tied.       Outpatient Medications Prior to Visit  Medication Sig Dispense Refill   amLODipine (NORVASC) 10 MG tablet Take 10 mg by mouth daily.     clindamycin (CLEOCIN-Carly) 1 % lotion Apply topically daily. 60 mL 4   levocetirizine (XYZAL) 5 MG tablet Take 1 tablet (5 mg total) by mouth every evening. 90 tablet 0   tranexamic acid (LYSTEDA) 650 MG TABS tablet Take 2 tablets (1,300 mg total) by mouth 3 (three) times daily. Take during menses for a maximum of five days 30 tablet 2   triamcinolone cream (KENALOG) 0.5 % Apply 1 Application topically 3 (three) times daily. 30 g 2   megestrol (MEGACE) 20 MG tablet Take 1 tablet (20 mg total) by mouth 2 (two) times daily. 60 tablet 2   No facility-administered medications prior to visit.     ROS Review of Systems  Constitutional: Negative.  Negative for chills, diaphoresis and fatigue.  HENT: Negative.  Negative for sore throat and trouble swallowing.   Eyes: Negative.   Respiratory:  Negative for cough, chest tightness and wheezing.   Cardiovascular:  Negative for chest pain, palpitations and leg swelling.  Gastrointestinal: Negative.  Negative for abdominal pain, constipation, diarrhea, nausea and vomiting.  Endocrine: Negative.   Genitourinary: Negative.  Negative for difficulty urinating.  Musculoskeletal: Negative.   Skin:  Positive for rash.       Intermittent hives and itching No rash today  Neurological:  Negative for dizziness.  Hematological:  Negative for adenopathy.  Psychiatric/Behavioral: Negative.      Objective:  BP 128/82 (BP Location: Left Arm, Patient Position: Sitting, Cuff Size: Normal)   Pulse 95   Temp 98.8 F (37.1 C) (Oral)   Resp 16   Ht 4\' 11"  (1.499 m)   Wt 160 lb 3.2 oz (72.7 kg)   LMP 04/25/2023   SpO2 97%   BMI 32.36 kg/m   BP Readings from Last 3 Encounters:  05/13/23 128/82  05/03/23 (!) 127/96  01/15/23 (!) 144/96    Wt Readings from Last 3 Encounters:  05/13/23 160 lb 3.2 oz (72.7 kg)  01/15/23 158 lb 6.4 oz (71.8 kg)  01/05/23 156 lb 8 oz (71 kg)    Physical Exam Vitals reviewed.  Constitutional:      Appearance: Normal appearance.  HENT:     Mouth/Throat:     Mouth: Mucous membranes are moist.  Eyes:     General: No scleral icterus.    Conjunctiva/sclera: Conjunctivae normal.  Cardiovascular:     Rate and Rhythm: Normal rate and regular rhythm.     Heart sounds: No murmur heard.    No friction rub. No gallop.  Pulmonary:     Effort: Pulmonary effort is normal.     Breath sounds: No stridor. No wheezing, rhonchi or rales.  Abdominal:     General: Abdomen is flat.     Palpations: There is no mass.     Tenderness: There is no abdominal tenderness. There is no guarding.     Hernia: No hernia is  present.  Musculoskeletal:        General: Normal range of motion.     Cervical back: Neck supple.     Right lower leg: No edema.     Left lower leg: No edema.  Lymphadenopathy:     Cervical: No cervical adenopathy.  Skin:    General: Skin is warm and dry.  Neurological:     General: No focal deficit present.     Mental Status: She is alert. Mental status is at baseline.  Psychiatric:        Mood and Affect: Mood normal.        Behavior: Behavior normal.     Lab Results  Component Value Date   WBC 6.1 05/13/2023   HGB 12.4 05/13/2023   HCT 37.5 05/13/2023   PLT 180.0 05/13/2023   GLUCOSE 96 05/13/2023   ALT 16 01/05/2023   AST 18 01/05/2023   NA 140 05/13/2023   K 3.9 05/13/2023   CL 109 05/13/2023   CREATININE 0.70 05/13/2023   BUN 12 05/13/2023   CO2 26 05/13/2023   TSH 0.65 01/05/2023   INR 1.1 (H) 01/05/2023    MM 3D SCREENING MAMMOGRAM BILATERAL BREAST Result Date: 10/20/2022 CLINICAL DATA:  Screening. EXAM: DIGITAL SCREENING BILATERAL MAMMOGRAM WITH TOMOSYNTHESIS AND CAD TECHNIQUE: Bilateral screening digital craniocaudal and mediolateral oblique mammograms were obtained. Bilateral screening digital breast tomosynthesis was performed. The images were evaluated with computer-aided detection. COMPARISON:  None available. ACR Breast Density Category b: There are scattered areas of fibroglandular density. FINDINGS: There are no findings suspicious for malignancy. IMPRESSION: No mammographic evidence of malignancy. A result letter of this screening mammogram will be mailed directly to the patient. RECOMMENDATION: Screening mammogram in one year. (Code:SM-B-01Y) BI-RADS CATEGORY  1: Negative. Electronically Signed   By: Meda Klinefelter M.D.   On: 10/20/2022 09:34    Assessment & Plan:   Primary hypertension- Her BP is well controlled. -     Basic metabolic panel; Future -     CBC with Differential/Platelet; Future  Urticaria -     Montelukast Sodium; Take 1 tablet  (10 mg total) by mouth at bedtime.  Dispense: 90 tablet; Refill: 0 -     CBC with Differential/Platelet; Future -     C-reactive protein; Future     Follow-up: Return in about 6 months (around 11/10/2023).  Sanda Linger, MD

## 2023-05-18 ENCOUNTER — Ambulatory Visit: Payer: 59 | Attending: Obstetrics and Gynecology | Admitting: Physical Therapy

## 2023-05-18 ENCOUNTER — Encounter: Payer: Self-pay | Admitting: Physical Therapy

## 2023-05-18 DIAGNOSIS — R102 Pelvic and perineal pain: Secondary | ICD-10-CM | POA: Insufficient documentation

## 2023-05-18 DIAGNOSIS — R1032 Left lower quadrant pain: Secondary | ICD-10-CM | POA: Insufficient documentation

## 2023-05-18 DIAGNOSIS — M62838 Other muscle spasm: Secondary | ICD-10-CM | POA: Diagnosis not present

## 2023-05-18 DIAGNOSIS — R278 Other lack of coordination: Secondary | ICD-10-CM | POA: Diagnosis not present
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
## 2023-05-20 ENCOUNTER — Encounter: Payer: Self-pay | Admitting: Internal Medicine

## 2023-06-01 ENCOUNTER — Ambulatory Visit: Payer: Self-pay | Admitting: Physical Therapy

## 2023-06-01 ENCOUNTER — Encounter: Payer: Self-pay | Admitting: Physical Therapy

## 2023-06-01 DIAGNOSIS — R102 Pelvic and perineal pain: Secondary | ICD-10-CM

## 2023-06-01 DIAGNOSIS — M62838 Other muscle spasm: Secondary | ICD-10-CM | POA: Diagnosis not present

## 2023-06-01 DIAGNOSIS — R1032 Left lower quadrant pain: Secondary | ICD-10-CM | POA: Diagnosis not present

## 2023-06-01 DIAGNOSIS — R278 Other lack of coordination: Secondary | ICD-10-CM
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
## 2023-06-07 ENCOUNTER — Other Ambulatory Visit: Payer: Self-pay

## 2023-06-07 NOTE — Progress Notes (Signed)
 06/07/2023 Name: [name] MRN: [id number] DOB: 13-Mar-1981  Chief Complaint  Patient presents with   Hypertension    [name] is a 43 y.o. year old female who presented for a telephone visit.   They were referred to the pharmacist by their PCP for assistance in managing hypertension.    Subjective:  Care Team: Primary Care Provider: Etta Grandchild, MD ; Next Scheduled Visit: not yet scheduled  Medication Access/Adherence  Current Pharmacy:  [facility] [address], [place] - [address] [place] [address] Phone: [phone] Fax: [phone]  [id number] - [place], [place] - [address] AT [facility] [address] [place] [address] Phone: [phone] Fax: [phone]   Patient reports affordability concerns with their medications: No  Patient reports access/transportation concerns to their pharmacy: No  Patient reports adherence concerns with their medications:  No     Hypertension:  Current medications: amlodipine 10 mg daily (PM) Medications previously tried: triamterene-hydrochlorothiazide 37.5-25 mg daily   Despite amlodipine not in fill history, she confirmed with me on the phone she has amlodipine 10 mg tablets in the bottle from [employer]. However, she notes that date on prescription bottle says 03/01/2022. She thinks that when she moved recently the old and new bottles may have gotten mixed up. Has about 15 tablets remaining. Denies any missed doses in the past 2 weeks  Patient has a validated, automated, upper arm home BP cuff. Has not checked at home since last visit ([employer] brand BP cuff)  Current blood pressure readings readings: patient checked while on the phone with me 132/90, pulse 88, but realized she was sitting with feet crossed, on recheck 126/91 pulse 83. Of note, could hear kids in the background during the visit and she says  she was not able to fully rest and relax prior to checking.  Patient denies hypotensive s/sx including dizziness, lightheadedness. Had one episode at work  Patient denies hypertensive symptoms including headache, chest pain, shortness of breath  Current meal patterns:  - Has stopped adding salt to food, substitutes with [employer] - Denies caffeine intake - Drinks: orange juice, sprite   Current physical activity: has been going to pelvic floor therapy exercises. Has started walking in her neighborhood 1-2x per week for ~20 minutes at a time   Objective: Vitals:   06/07/23 1334  BP: (!) 126/91   Lab Results  Component Value Date   CREATININE 0.70 05/13/2023   BUN 12 05/13/2023   NA 140 05/13/2023   K 3.9 05/13/2023   CL 109 05/13/2023   CO2 26 05/13/2023    Medications Reviewed Today     Reviewed by Vela Prose, RPH (Pharmacist) on 06/07/23 at 1308  Med List Status: <None>   Medication Order Taking? Sig Documenting Provider Last Dose Status Informant  amLODipine (NORVASC) 10 MG tablet [id number] Yes Take 10 mg by mouth daily. [provider] Taking Active   clindamycin (CLEOCIN-T) 1 % lotion [id number]  Apply topically daily. Terri Piedra, DO  Active   levocetirizine (XYZAL) 5 MG tablet [id number] No Take 1 tablet (5 mg total) by mouth every evening.  Patient not taking: Reported on 06/07/2023   Etta Grandchild, MD Not Taking Active            Med Note Alycia Patten May 03, 2023 12:46 PM) Taking PRN  montelukast (SINGULAIR) 10  MG tablet [id number] Yes Take 1 tablet (10 mg total) by mouth at bedtime. Etta Grandchild, MD Taking Active   tranexamic acid (LYSTEDA) 650 MG TABS tablet [id number]  Take 2 tablets (1,300 mg total) by mouth 3 (three) times daily. Take during menses for a maximum of five days Hermina Staggers, MD  Active            Med Note Alycia Patten May 03, 2023 12:45 PM) Did not think it was helping - reports bleeding is now regular   triamcinolone cream (KENALOG) 0.5 % [id number]  Apply 1 Application topically 3 (three) times daily. Etta Grandchild, MD  Active            Med Note Noe Gens, [place]   Tue Jan 27, 2023  7:12 PM) Taking daily              Assessment/Plan:   Hypertension: - Currently blood pressure is essentially at goal <130/80 based on last clinic BP reading 128/82. However, home BP reading today shows SBP at goal and DBP elevated. Discussed this may be due to differences in accuracy with home monitor or not being able to fully relax prior to checking. Given most recent clinic reading is at goal will not recommend any medication changes at this time. Recommended to continue to monitor home BP readings and keep a log to review at future appointments. Recommended bringing her home BP cuff to next clinic visit to test for accuracy. - Reviewed long term cardiovascular and renal outcomes of uncontrolled blood pressure - Reviewed appropriate blood pressure monitoring technique and reviewed goal blood pressure. Recommended to check home blood pressure and heart rate at least a few times weekly. Patient notes her mom has a BP monitor and will check with her monitor to see if she obtains similar readings. - Recommend to continue amlodipine 10 mg daily. Will collaborate with PCP for refill.  - Patient is due for lipid panel at next PCP visit    Follow Up Plan: PharmD on 08/30/23  Jarrett Ables, PharmD PGY-1 Pharmacy Resident

## 2023-06-08 ENCOUNTER — Encounter: Payer: Self-pay | Admitting: Physical Therapy

## 2023-06-08 ENCOUNTER — Ambulatory Visit: Payer: Self-pay | Admitting: Physical Therapy

## 2023-06-08 ENCOUNTER — Other Ambulatory Visit: Payer: Self-pay | Admitting: Internal Medicine

## 2023-06-08 DIAGNOSIS — I10 Essential (primary) hypertension: Secondary | ICD-10-CM

## 2023-06-08 DIAGNOSIS — R278 Other lack of coordination: Secondary | ICD-10-CM

## 2023-06-08 DIAGNOSIS — R102 Pelvic and perineal pain: Secondary | ICD-10-CM | POA: Diagnosis not present

## 2023-06-08 DIAGNOSIS — M62838 Other muscle spasm: Secondary | ICD-10-CM | POA: Diagnosis not present

## 2023-06-08 DIAGNOSIS — R1032 Left lower quadrant pain: Secondary | ICD-10-CM | POA: Diagnosis not present

## 2023-06-08 MED ORDER — AMLODIPINE BESYLATE 10 MG PO TABS: 10.0000 mg | ORAL_TABLET | Freq: Every day | ORAL | 0 refills | Status: DC

## 2023-06-08 NOTE — Therapy (Signed)
 OUTPATIENT PHYSICAL THERAPY FEMALE PELVIC TREATMENT   Patient Name: [name] MRN: [id number] DOB:29-Dec-1980, 43 y.o., female Today's Date: 06/08/2023  END OF SESSION:  PT End of Session - 06/08/23 0945     Visit Number 7    Authorization Type [employer] and Managed Medicaid    Authorization Time Period [employer] authorized 2/24- 3/25 [id number]    PT Start Time 0933    PT Stop Time 1012    PT Time Calculation (min) 39 min    Activity Tolerance Patient tolerated treatment well    Behavior During Therapy [facility] for tasks assessed/performed                    Past Medical History:  Diagnosis Date   Dysplasia of cervix, low grade (CIN 1) 11/12/2020   Colpo CIN 1 6/22  Cryo 8/22     No pertinent past medical history    Past Surgical History:  Procedure Laterality Date   CESAREAN SECTION  2004   DILATION AND CURETTAGE OF UTERUS  2007   TUBAL LIGATION  2008   Patient Active Problem List   Diagnosis Date Noted   Urticaria 05/13/2023   ASCUS of cervix with negative high risk HPV 10/09/2022   Visit for screening mammogram 07/07/2022   Migraine without aura and without status migrainosus, not intractable 07/22/2021   Primary hypertension 06/06/2021    PCP: Etta Grandchild, MD PCP - General  REFERRING PROVIDER: Lorriane Shire, MD Ref Provider  REFERRING DIAG: R10.2 (ICD-10-CM) - Pelvic pain   THERAPY DIAG:  Other lack of coordination  Other muscle spasm  Pelvic pain  Left lower quadrant abdominal pain  Rationale for Evaluation and Treatment: Rehabilitation  ONSET DATE: 6 months ago  SUBJECTIVE:                                                                                                                                                                                           SUBJECTIVE STATEMENT: Pt reports that she is feeling better. Less pain with lifting  Holding pee longer Less pain with sex, feels like she pulled something  after intercourse Has changed how she eats Still drinking just water ( propel grape water) - likes that, feels better.Used to drink lots of soda and juice.  Pt feels like she is there - feels ready for discharge today  Fluid intake: Yes: soda and juice- feels like it could be that  when she holds her urine at works, she is busy               Pt has to wear pads, tampons  not enough.  PAIN:  Are you having pain? 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[employer]  PLOF: Independent  PATIENT GOALS: not to have pain and figure out why  PERTINENT HISTORY:  Bleeding and pain with periods and bloating, diarrhea, constipation Sexual abuse: to be asked  BOWEL MOVEMENT: Pain with bowel movement: No Type of bowel movement:Type (Bristol Stool Scale) 1-7 Fully empty rectum: No Leakage: No Pads: No Fiber supplement: No  URINATION: Pain with urination: No Fully empty bladder: Yes:   Stream: Strong Urgency: Yes: when she waits too long at work Frequency: no Leakage: Urge to void, Walking to the bathroom, Coughing, Sneezing, Laughing, and Exercise Pads: Yes:    INTERCOURSE: Pain with intercourse: Initial Penetration, During Penetration, After Intercourse, and During Climax Ability to have vaginal penetration:  Yes:   Climax: yes Marinoff Scale: 1/3  PREGNANCY: Vaginal deliveries 2 Tearing No C-section deliveries 1 Currently pregnant No  PROLAPSE: None   OBJECTIVE:  Note:  Objective measures were completed at Evaluation unless otherwise noted.  DIAGNOSTIC FINDINGS:  Normal ultrasound  COGNITION: Overall cognitive status: Within functional limits for tasks assessed     SENSATION: Light touch: Appears intact Proprioception: Appears intact  MUSCLE LENGTH: Hamstrings: Right 80 deg; Left 80 deg   LUMBAR SPECIAL TESTS:  Straight leg raise test: Negative     POSTURE: increased lumbar lordosis  PELVIC ALIGNMENT: seems even  LUMBARAROM/PROM: seems normal   LOWER EXTREMITY ROM: some tightness bilateral hips   LOWER EXTREMITY MMT: seems within functional limitations  PALPATION:   General  upper chest breathing, abdominal bloating, pain throughout with palpation. More LLQ, some tenderness C section scar                 External Perineal Exam within functional limitations                             Internal Pelvic Floor high tone- some tenderness in left OI  Patient confirms identification and approves PT to assess internal pelvic floor and treatment Yes  PELVIC MMT:   MMT eval  Vaginal 4/5  Internal Anal Sphincter   External Anal Sphincter   Puborectalis   Diastasis Recti 2 fingers  (Blank rows = not tested)        TONE: high  PROLAPSE: no TODAY'S TREATMENT:  DATE: 06/08/23   There acts- review of progress, HEP  Neuro reed- transverse abdominis breath  ball press with transverse abdominis breath supine and seated Shoulder extension with transverse abdominis breath  Child's pose    HOME EXERCISE PROGRAM: [id number]   ASSESSMENT:  CLINICAL IMPRESSION: Pt has done well in PT, made good progress and will be discharged today.      OBJECTIVE IMPAIRMENTS: decreased ROM, increased muscle spasms, and pain.   ACTIVITY LIMITATIONS: lifting, bending, continence, and toileting  PARTICIPATION LIMITATIONS:  occupation  PERSONAL FACTORS: Education are also affecting patient's functional outcome.   REHAB POTENTIAL: Good  CLINICAL DECISION MAKING: Stable/uncomplicated  EVALUATION COMPLEXITY: Low   GOALS: Goals reviewed with patient? Yes  SHORT TERM GOALS: Target date: 05/11/2023    Pt will be I with healthy bladder recommendations Baseline: soda and water Goal status: met  2.  Pt will reduce bladder irritants Baseline: no Goal status: met   3.  Pt will be I with abdominal massage Baseline: no Goal status: met  4.  Pt will be I with her initial HEP Baseline: no Goal status: progressing  5. Pt will be able to lift #15 10 times without increased abdominal pain Baseline- no Goals status- discharged  LONG TERM GOALS: Target date: 10/04/2023  Pt will report max 2/10 abdominal pain Baseline:  Goal status:  met 2.  Pt will report max 2/10 pain with intercourse Baseline:  Goal status: met  3.  Pt will be  I advanced HEP Baseline:  Goal status: met- will go to [employer]  4.  Pt will soak 0 pads/ day Baseline: leaking with urgency Goal status: discharged ( improving)   PLAN:  PT FREQUENCY: 1-2x/week  PT DURATION: 6 months  PLANNED INTERVENTIONS: 97110-Therapeutic exercises, 97530- Therapeutic activity, 97112- Neuromuscular re-education, 97535- Self Care, 29562- Manual therapy, [phone]- Electrical stimulation (manual), Dry Needling, Joint mobilization, Joint manipulation, Spinal manipulation, Spinal mobilization, Scar mobilization, Moist heat, and Biofeedback PLAN FOR NEXT SESSION: cont abdominal strengthening and PF downtraining   Anjalina Bergevin, PT 06/08/23 9:48 AM    PHYSICAL THERAPY DISCHARGE SUMMARY   Patient agrees to discharge. Patient goals were partially met. Patient is being discharged due to being pleased with the current functional level.  Aritha Huckeba, PT 06/08/23 10:08 AM

## 2023-06-15 ENCOUNTER — Encounter: Payer: Self-pay | Admitting: Physical Therapy

## 2023-06-22 ENCOUNTER — Encounter: Payer: Self-pay | Admitting: Physical Therapy

## 2023-07-06 ENCOUNTER — Encounter: Payer: 59 | Admitting: Physical Therapy

## 2023-07-13 ENCOUNTER — Encounter: Payer: 59 | Admitting: Physical Therapy

## 2023-08-27 ENCOUNTER — Telehealth: Payer: Self-pay

## 2023-08-27 DIAGNOSIS — Z202 Contact with and (suspected) exposure to infections with a predominantly sexual mode of transmission: Secondary | ICD-10-CM

## 2023-08-27 NOTE — Addendum Note (Signed)
 Addended by: Ninette Basque on: 08/27/2023 05:02 PM   Modules accepted: Orders

## 2023-08-27 NOTE — Telephone Encounter (Signed)
 Received phone call from [facility] employee reporting that pt was potentially exposed to syphilis.  She requested pt be tested in office.  She was informed by pt that she has appointment on 08/31/23 at [facility].  Confirmed pt is scheduled on 08/31/23.    Carolynne Citron, RN

## 2023-08-30 ENCOUNTER — Other Ambulatory Visit: Payer: Self-pay

## 2023-08-30 NOTE — Progress Notes (Signed)
 08/30/2023 Name: [name] MRN: [id number] DOB: [birth year]  Chief Complaint  Patient presents with   Hypertension    [name] is a 43 y.o. year old female who presented for a telephone visit.   They were referred to the pharmacist by their PCP for assistance in managing hypertension.   Care Team: Primary Care Provider: Arcadio Knuckles, MD ; Next Scheduled Visit: not yet scheduled  Medication Access/Adherence  Current Pharmacy:  [facility] [address] [place] - [address] [place] [address] Phone: [phone] Fax: [phone]  [id number] - [place], [place] - [address] AT [facility] [address] [place] [address] Phone: [phone] Fax: [phone]   Patient reports affordability concerns with their medications: No  Patient reports access/transportation concerns to their pharmacy: No  Patient reports adherence concerns with their medications:  No    Subjective: Patient confirms adherence to amlodipine  as prescribed and denies missed doses. Fill history shows potential non-adherence, but she says after the last fill in May she lost her prescription and had to get this refilled. This refill may not have been processed through insurance which explains why it does not show in fill history.   Hypertension:  Current medications: amlodipine  10 mg daily Medications previously tried: triamterene -hydrochlorothiazide 37.5-25 mg daily   Patient has a validated, automated, upper arm home BP cuff Current blood pressure readings readings: 130/85, recalls one reading 130/90s, but this was when she had a headache  Patient denies hypotensive s/sx including dizziness, lightheadedness.  Patient denies hypertensive symptoms including chest pain and shortness of breath. She has occasional migraines.  Current physical activity: started exercising - 20 minutes walking and 20  minutes running on the treadmill and weight lifting 4-5x per week, if she does not make it to the gym she will do exercises like squats at home  Objective:  Lab Results  Component Value Date   CREATININE 0.70 05/13/2023   BUN 12 05/13/2023   NA 140 05/13/2023   K 3.9 05/13/2023   CL 109 05/13/2023   CO2 26 05/13/2023    No results found for: CHOL, HDL, LDLCALC, LDLDIRECT, TRIG, CHOLHDL  Medications Reviewed Today     Reviewed by Philmore Bream, [facility] (Pharmacist) on 08/30/23 at 1329  Med List Status: <None>   Medication Order Taking? Sig Documenting Provider Last Dose Status Informant  amLODipine  (NORVASC ) 10 MG tablet [id number] Yes Take 1 tablet (10 mg total) by mouth daily. Arcadio Knuckles, MD  Active   clindamycin  (CLEOCIN -T) 1 % lotion [id number]  Apply topically daily. Dellar Fenton, DO  Active   levocetirizine (XYZAL ) 5 MG tablet [id number]  Take 1 tablet (5 mg total) by mouth every evening.  Patient not taking: Reported on 06/07/2023   Arcadio Knuckles, MD  Active            Med Note Petrina Bras May 03, 2023 12:46 PM) Taking PRN  montelukast  (SINGULAIR ) 10 MG tablet [id number]  Take 1 tablet (10 mg total) by mouth at bedtime. Arcadio Knuckles, MD  Active   tranexamic acid  (LYSTEDA ) 650 MG TABS tablet [id number]  Take 2 tablets (1,300 mg total) by mouth 3 (three) times daily. Take during menses for a maximum of five days Ervin, Michael L, MD  Active            Med Note Francetta Innocent, Nicki Barnacle P   Sun  May 03, 2023 12:45 PM) Did not think it was helping - reports bleeding is now regular  triamcinolone  cream (KENALOG ) 0.5 % [id number]  Apply 1 Application topically 3 (three) times daily. Arcadio Knuckles, MD  Active            Med Note Philmore Bream   Tue Jan 27, 2023  7:12 PM) Taking daily              Assessment/Plan:   Hypertension: - Currently controlled with last clinic BP reading 128/82 mm Hg. Patient reported home BP reading is close to goal. Recommend  to continue current regimen. Commended her for starting to exercise and encouraged her to keep this up! Recommended scheduling next PCP visit which is due in August. - Reviewed long term cardiovascular and renal outcomes of uncontrolled blood pressure - Reviewed appropriate blood pressure monitoring technique and reviewed goal blood pressure. Recommended to check home blood pressure and heart rate periodically. - Recommend to continue amlodipine  10 mg daily  - Patient is due for a lipid panel at next PCP visit   Follow Up Plan: Counseled patient to schedule PCP visit for August  Georga Killings, PharmD PGY-1 Pharmacy Resident

## 2023-08-31 ENCOUNTER — Other Ambulatory Visit: Payer: Self-pay

## 2023-08-31 ENCOUNTER — Other Ambulatory Visit (HOSPITAL_COMMUNITY)
Admission: RE | Admit: 2023-08-31 | Discharge: 2023-08-31 | Disposition: A | Discharge status: Home / Self Care | Source: Ambulatory Visit | Attending: Family Medicine | Admitting: Family Medicine

## 2023-08-31 ENCOUNTER — Ambulatory Visit

## 2023-08-31 VITALS — BP 141/98 | HR 73 | Wt 156.0 lb

## 2023-08-31 DIAGNOSIS — Z202 Contact with and (suspected) exposure to infections with a predominantly sexual mode of transmission: Secondary | ICD-10-CM

## 2023-08-31 NOTE — Progress Notes (Signed)
 Patient is here today for possible exposure to RPR from partner. Patient reports that she was notified last week regarding patient's positive RPR test. Patient request complete STD screening. Last unprotected intercourse was in April.   Self swab instructions given and specimen obtained. Explained patient will be contacted with any abnormal results. Patient verbalized understanding and was advised to contact our office for any other questions of concerns.   Lennart Quitter, RN 08/31/2023  10:52 AM

## 2023-09-01 LAB — RPR+HBSAG+HCVAB+...
HIV Screen 4th Generation wRfx: NONREACTIVE
Hep C Virus Ab: NONREACTIVE
Hepatitis B Surface Ag: NEGATIVE
RPR Ser Ql: NONREACTIVE

## 2023-09-01 LAB — CERVICOVAGINAL ANCILLARY ONLY
Bacterial Vaginitis (gardnerella): POSITIVE — AB
Candida Glabrata: NEGATIVE
Candida Vaginitis: NEGATIVE
Chlamydia: NEGATIVE
Comment: NEGATIVE
Comment: NEGATIVE
Comment: NEGATIVE
Comment: NEGATIVE
Comment: NEGATIVE
Comment: NORMAL
Neisseria Gonorrhea: NEGATIVE
Trichomonas: NEGATIVE

## 2023-09-04 ENCOUNTER — Ambulatory Visit: Payer: Self-pay | Admitting: Obstetrics and Gynecology

## 2023-09-04 DIAGNOSIS — B9689 Other specified bacterial agents as the cause of diseases classified elsewhere: Secondary | ICD-10-CM

## 2023-09-04 MED ORDER — FLUCONAZOLE 150 MG PO TABS: 150.0000 mg | ORAL_TABLET | Freq: Once | ORAL | 0 refills | Status: AC

## 2023-09-04 MED ORDER — METRONIDAZOLE 500 MG PO TABS
500.0000 mg | ORAL_TABLET | Freq: Two times a day (BID) | ORAL | 0 refills | Status: DC
Start: 2023-09-04 — End: 2023-09-11

## 2023-09-09 ENCOUNTER — Other Ambulatory Visit: Payer: Self-pay | Admitting: Obstetrics and Gynecology

## 2023-09-09 DIAGNOSIS — B9689 Other specified bacterial agents as the cause of diseases classified elsewhere: Secondary | ICD-10-CM

## 2023-09-09 MED ORDER — METRONIDAZOLE 0.75 % VA GEL
1.0000 | Freq: Two times a day (BID) | VAGINAL | 0 refills | Status: AC
Start: 2023-09-09 — End: 2023-09-14

## 2023-12-14 ENCOUNTER — Ambulatory Visit: Admitting: Internal Medicine

## 2024-01-04 ENCOUNTER — Ambulatory Visit: Admitting: Internal Medicine

## 2024-01-18 ENCOUNTER — Ambulatory Visit: Admitting: Internal Medicine

## 2024-03-22 ENCOUNTER — Ambulatory Visit (INDEPENDENT_AMBULATORY_CARE_PROVIDER_SITE_OTHER): Admitting: Internal Medicine

## 2024-03-22 ENCOUNTER — Other Ambulatory Visit: Payer: Self-pay | Admitting: Internal Medicine

## 2024-03-22 ENCOUNTER — Ambulatory Visit: Payer: Self-pay | Admitting: Internal Medicine

## 2024-03-22 ENCOUNTER — Encounter: Payer: Self-pay | Admitting: Internal Medicine

## 2024-03-22 VITALS — BP 138/98 | HR 81 | Temp 98.6°F | Resp 16 | Ht 59.0 in | Wt 170.2 lb

## 2024-03-22 DIAGNOSIS — E66811 Obesity, class 1: Secondary | ICD-10-CM | POA: Diagnosis not present

## 2024-03-22 DIAGNOSIS — E785 Hyperlipidemia, unspecified: Secondary | ICD-10-CM

## 2024-03-22 DIAGNOSIS — Z1231 Encounter for screening mammogram for malignant neoplasm of breast: Secondary | ICD-10-CM

## 2024-03-22 DIAGNOSIS — I10 Essential (primary) hypertension: Secondary | ICD-10-CM

## 2024-03-22 DIAGNOSIS — Z6834 Body mass index (BMI) 34.0-34.9, adult: Secondary | ICD-10-CM

## 2024-03-22 DIAGNOSIS — L509 Urticaria, unspecified: Secondary | ICD-10-CM

## 2024-03-22 LAB — HEPATIC FUNCTION PANEL
ALT: 20 U/L (ref 3–35)
AST: 20 U/L (ref 5–37)
Albumin: 4.4 g/dL (ref 3.5–5.2)
Alkaline Phosphatase: 73 U/L (ref 39–117)
Bilirubin, Direct: 0 mg/dL — ABNORMAL LOW (ref 0.1–0.3)
Total Bilirubin: 0.3 mg/dL (ref 0.2–1.2)
Total Protein: 7.5 g/dL (ref 6.0–8.3)

## 2024-03-22 LAB — CBC WITH DIFFERENTIAL/PLATELET
Basophils Absolute: 0 K/uL (ref 0.0–0.1)
Basophils Relative: 0.3 % (ref 0.0–3.0)
Eosinophils Absolute: 0.2 K/uL (ref 0.0–0.7)
Eosinophils Relative: 3.6 % (ref 0.0–5.0)
HCT: 35.9 % — ABNORMAL LOW (ref 36.0–46.0)
Hemoglobin: 12 g/dL (ref 12.0–15.0)
Lymphocytes Relative: 42.8 % (ref 12.0–46.0)
Lymphs Abs: 2.4 K/uL (ref 0.7–4.0)
MCHC: 33.4 g/dL (ref 30.0–36.0)
MCV: 91.9 fl (ref 78.0–100.0)
Monocytes Absolute: 0.6 K/uL (ref 0.1–1.0)
Monocytes Relative: 9.7 % (ref 3.0–12.0)
Neutro Abs: 2.5 K/uL (ref 1.4–7.7)
Neutrophils Relative %: 43.6 % (ref 43.0–77.0)
Platelets: 200 K/uL (ref 150.0–400.0)
RBC: 3.91 Mil/uL (ref 3.87–5.11)
RDW: 13.9 % (ref 11.5–15.5)
WBC: 5.7 K/uL (ref 4.0–10.5)

## 2024-03-22 LAB — URINALYSIS, ROUTINE W REFLEX MICROSCOPIC
Bilirubin Urine: NEGATIVE
Hgb urine dipstick: NEGATIVE
Ketones, ur: NEGATIVE
Leukocytes,Ua: NEGATIVE
Nitrite: NEGATIVE
Specific Gravity, Urine: >=1.03 — AB (ref 1.000–1.030)
Total Protein, Urine: NEGATIVE
Urine Glucose: NEGATIVE
Urobilinogen, UA: 0.2 (ref 0.0–1.0)
pH: 6 (ref 5.0–8.0)

## 2024-03-22 LAB — LIPID PANEL
Cholesterol: 141 mg/dL (ref 28–200)
HDL: 53.4 mg/dL
LDL Cholesterol: 69 mg/dL (ref 10–99)
NonHDL: 87.69
Total CHOL/HDL Ratio: 3
Triglycerides: 91 mg/dL (ref 10.0–149.0)
VLDL: 18.2 mg/dL (ref 0.0–40.0)

## 2024-03-22 LAB — BASIC METABOLIC PANEL WITH GFR
BUN: 7 mg/dL (ref 6–23)
CO2: 26 meq/L (ref 19–32)
Calcium: 9 mg/dL (ref 8.4–10.5)
Chloride: 107 meq/L (ref 96–112)
Creatinine, Ser: 0.62 mg/dL (ref 0.40–1.20)
GFR: 109.6 mL/min
Glucose, Bld: 90 mg/dL (ref 70–99)
Potassium: 3.7 meq/L (ref 3.5–5.1)
Sodium: 140 meq/L (ref 135–145)

## 2024-03-22 LAB — C-REACTIVE PROTEIN: CRP: <0.5 mg/dL — ABNORMAL LOW (ref 1.0–20.0)

## 2024-03-22 LAB — TSH: TSH: 0.88 u[IU]/mL (ref 0.35–5.50)

## 2024-03-22 MED ORDER — AMLODIPINE BESYLATE 5 MG PO TABS: 5.0000 mg | ORAL_TABLET | Freq: Every day | ORAL | 0 refills | Status: AC

## 2024-03-22 MED ORDER — MONTELUKAST SODIUM 10 MG PO TABS: 10.0000 mg | ORAL_TABLET | Freq: Every day | ORAL | 0 refills | Status: AC

## 2024-03-22 MED ORDER — SPIRONOLACTONE 25 MG PO TABS: 25.0000 mg | ORAL_TABLET | Freq: Every day | ORAL | 0 refills | Status: AC

## 2024-03-22 MED ORDER — LEVOCETIRIZINE DIHYDROCHLORIDE 5 MG PO TABS: 5.0000 mg | ORAL_TABLET | Freq: Every evening | ORAL | 0 refills | Status: AC

## 2024-03-22 MED ORDER — ZEPBOUND 2.5 MG/0.5ML ~~LOC~~ SOAJ: 2.5000 mg | SUBCUTANEOUS | 0 refills | Status: DC

## 2024-03-22 NOTE — Patient Instructions (Signed)
 Hypertension, Adult High blood pressure (hypertension) is when the force of blood pumping through the arteries is too strong. The arteries are the blood vessels that carry blood from the heart throughout the body. Hypertension forces the heart to work harder to pump blood and may cause arteries to become narrow or stiff. Untreated or uncontrolled hypertension can lead to a heart attack, heart failure, a stroke, kidney disease, and other problems. A blood pressure reading consists of a higher number over a lower number. Ideally, your blood pressure should be below 120/80. The first ("top") number is called the systolic pressure. It is a measure of the pressure in your arteries as your heart beats. The second ("bottom") number is called the diastolic pressure. It is a measure of the pressure in your arteries as the heart relaxes. What are the causes? The exact cause of this condition is not known. There are some conditions that result in high blood pressure. What increases the risk? Certain factors may make you more likely to develop high blood pressure. Some of these risk factors are under your control, including: Smoking. Not getting enough exercise or physical activity. Being overweight. Having too much fat, sugar, calories, or salt (sodium) in your diet. Drinking too much alcohol. Other risk factors include: Having a personal history of heart disease, diabetes, high cholesterol, or kidney disease. Stress. Having a family history of high blood pressure and high cholesterol. Having obstructive sleep apnea. Age. The risk increases with age. What are the signs or symptoms? High blood pressure may not cause symptoms. Very high blood pressure (hypertensive crisis) may cause: Headache. Fast or irregular heartbeats (palpitations). Shortness of breath. Nosebleed. Nausea and vomiting. Vision changes. Severe chest pain, dizziness, and seizures. How is this diagnosed? This condition is diagnosed by  measuring your blood pressure while you are seated, with your arm resting on a flat surface, your legs uncrossed, and your feet flat on the floor. The cuff of the blood pressure monitor will be placed directly against the skin of your upper arm at the level of your heart. Blood pressure should be measured at least twice using the same arm. Certain conditions can cause a difference in blood pressure between your right and left arms. If you have a high blood pressure reading during one visit or you have normal blood pressure with other risk factors, you may be asked to: Return on a different day to have your blood pressure checked again. Monitor your blood pressure at home for 1 week or longer. If you are diagnosed with hypertension, you may have other blood or imaging tests to help your health care provider understand your overall risk for other conditions. How is this treated? This condition is treated by making healthy lifestyle changes, such as eating healthy foods, exercising more, and reducing your alcohol intake. You may be referred for counseling on a healthy diet and physical activity. Your health care provider may prescribe medicine if lifestyle changes are not enough to get your blood pressure under control and if: Your systolic blood pressure is above 130. Your diastolic blood pressure is above 80. Your personal target blood pressure may vary depending on your medical conditions, your age, and other factors. Follow these instructions at home: Eating and drinking  Eat a diet that is high in fiber and potassium, and low in sodium, added sugar, and fat. An example of this eating plan is called the DASH diet. DASH stands for Dietary Approaches to Stop Hypertension. To eat this way: Eat  plenty of fresh fruits and vegetables. Try to fill one half of your plate at each meal with fruits and vegetables. Eat whole grains, such as whole-wheat pasta, brown rice, or whole-grain bread. Fill about one  fourth of your plate with whole grains. Eat or drink low-fat dairy products, such as skim milk or low-fat yogurt. Avoid fatty cuts of meat, processed or cured meats, and poultry with skin. Fill about one fourth of your plate with lean proteins, such as fish, chicken without skin, beans, eggs, or tofu. Avoid pre-made and processed foods. These tend to be higher in sodium, added sugar, and fat. Reduce your daily sodium intake. Many people with hypertension should eat less than 1,500 mg of sodium a day. Do not drink alcohol if: Your health care provider tells you not to drink. You are pregnant, may be pregnant, or are planning to become pregnant. If you drink alcohol: Limit how much you have to: 0-1 drink a day for women. 0-2 drinks a day for men. Know how much alcohol is in your drink. In the [place] one drink equals one 12 oz bottle of beer (355 mL), one 5 oz glass of wine (148 mL), or one 1 oz glass of hard liquor (44 mL). Lifestyle  Work with your health care provider to maintain a healthy body weight or to lose weight. Ask what an ideal weight is for you. Get at least 30 minutes of exercise that causes your heart to beat faster (aerobic exercise) most days of the week. Activities may include walking, swimming, or biking. Include exercise to strengthen your muscles (resistance exercise), such as Pilates or lifting weights, as part of your weekly exercise routine. Try to do these types of exercises for 30 minutes at least 3 days a week. Do not use any products that contain nicotine or tobacco. These products include cigarettes, chewing tobacco, and vaping devices, such as e-cigarettes. If you need help quitting, ask your health care provider. Monitor your blood pressure at home as told by your health care provider. Keep all follow-up visits. This is important. Medicines Take over-the-counter and prescription medicines only as told by your health care provider. Follow directions carefully. Blood  pressure medicines must be taken as prescribed. Do not skip doses of blood pressure medicine. Doing this puts you at risk for problems and can make the medicine less effective. Ask your health care provider about side effects or reactions to medicines that you should watch for. Contact a health care provider if you: Think you are having a reaction to a medicine you are taking. Have headaches that keep coming back (recurring). Feel dizzy. Have swelling in your ankles. Have trouble with your vision. Get help right away if you: Develop a severe headache or confusion. Have unusual weakness or numbness. Feel faint. Have severe pain in your chest or abdomen. Vomit repeatedly. Have trouble breathing. These symptoms may be an emergency. Get help right away. Call 911. Do not wait to see if the symptoms will go away. Do not drive yourself to the hospital. Summary Hypertension is when the force of blood pumping through your arteries is too strong. If this condition is not controlled, it may put you at risk for serious complications. Your personal target blood pressure may vary depending on your medical conditions, your age, and other factors. For most people, a normal blood pressure is less than 120/80. Hypertension is treated with lifestyle changes, medicines, or a combination of both. Lifestyle changes include losing weight, eating a healthy,  low-sodium diet, exercising more, and limiting alcohol. This information is not intended to replace advice given to you by your health care provider. Make sure you discuss any questions you have with your health care provider. Document Revised: 01/08/2021 Document Reviewed: 01/08/2021 Elsevier Patient Education  2024 [employer].

## 2024-03-22 NOTE — Progress Notes (Signed)
 "     Subjective:  Patient ID: [name], female    DOB: 1980/05/26  Age: 44 y.o. MRN: [id number]  CC: Hypertension, Rash, and Hyperlipidemia   HPI ALMARIE KURDZIEL presents for f/up ---  Discussed the use of AI scribe software for clinical note transcription with the patient, who gave verbal consent to proceed.  History of Present Illness [name] is a 44 year old female with hypertension who presents with hives and high blood pressure.  She experiences intermittent hives, particularly after taking a shower. The hives are itchy and primarily located on her thighs. She is unsure if the heat of the shower is a trigger. She has been taking an antihistamine at night, which was previously prescribed, but continues to experience symptoms.  She has a history of hypertension and was previously taking amlodipine , which she stopped in October due to difficulty swallowing the pills and a dislike for the medication. She started eating grapefruit daily around the time she stopped the medication. She attributes her high blood pressure to not eating in the morning. She experiences swelling in her feet and is unsure of when her last EKG was performed.  She experienced a headache yesterday, which she believes was due to not eating enough. No chest pain or shortness of breath. She had a cold last week with symptoms of cough and sore throat, which have since resolved. She was taking a medication she thought was cortisone for the cold, but it did not contain a decongestant.  She reports not having a menstrual period for two months, with the last occurrence in December. She mentions taking pictures of a rash on her back that comes and goes, but it is not present at the time of the visit.     Outpatient Medications Prior to Visit  Medication Sig Dispense Refill   montelukast  (SINGULAIR ) 10 MG tablet Take 1 tablet (10 mg total) by mouth at bedtime. 90 tablet 0   amLODipine  (NORVASC )  10 MG tablet Take 1 tablet (10 mg total) by mouth daily. (Patient not taking: Reported on 03/22/2024) 90 tablet 0   levocetirizine (XYZAL ) 5 MG tablet Take 1 tablet (5 mg total) by mouth every evening. 90 tablet 0   tranexamic acid  (LYSTEDA ) 650 MG TABS tablet Take 2 tablets (1,300 mg total) by mouth 3 (three) times daily. Take during menses for a maximum of five days (Patient not taking: Reported on 08/31/2023) 30 tablet 2   triamcinolone  cream (KENALOG ) 0.5 % Apply 1 Application topically 3 (three) times daily. (Patient not taking: Reported on 08/31/2023) 30 g 2   No facility-administered medications prior to visit.    ROS Review of Systems  Constitutional:  Positive for unexpected weight change (wt gain). Negative for appetite change, chills, diaphoresis, fatigue and fever.  HENT: Negative.    Eyes: Negative.   Respiratory: Negative.  Negative for cough, chest tightness, shortness of breath and wheezing.   Cardiovascular:  Negative for chest pain, palpitations and leg swelling.  Gastrointestinal:  Negative for abdominal pain, constipation, diarrhea, nausea and vomiting.  Endocrine: Negative.   Genitourinary: Negative.  Negative for difficulty urinating and dysuria.  Musculoskeletal: Negative.  Negative for arthralgias, joint swelling and myalgias.  Skin:  Positive for rash. Negative for color change.  Neurological: Negative.  Negative for dizziness and weakness.  Hematological:  Negative for adenopathy. Does not bruise/bleed easily.  Psychiatric/Behavioral: Negative.      Objective:  BP (!) 138/98 (BP Location: Left Arm, Patient Position:  Sitting, Cuff Size: Normal)   Pulse 81   Temp 98.6 F (37 C) (Oral)   Resp 16   Ht 4' 11 (1.499 m)   Wt 170 lb 3.2 oz (77.2 kg)   LMP 03/16/2024 (Exact Date)   SpO2 97%   BMI 34.38 kg/m   BP Readings from Last 3 Encounters:  03/22/24 (!) 138/98  08/31/23 (!) 141/98  08/30/23 130/85    Wt Readings from Last 3 Encounters:  03/22/24 170 lb  3.2 oz (77.2 kg)  08/31/23 156 lb (70.8 kg)  05/13/23 160 lb 3.2 oz (72.7 kg)    Physical Exam Vitals reviewed.  Constitutional:      Appearance: Normal appearance.  HENT:     Nose: Nose normal.     Mouth/Throat:     Mouth: Mucous membranes are moist.  Eyes:     General: No scleral icterus.    Conjunctiva/sclera: Conjunctivae normal.  Cardiovascular:     Rate and Rhythm: Normal rate and regular rhythm.     Heart sounds: Normal heart sounds, S1 normal and S2 normal. No murmur heard.    Comments: EKG --- NSR, 72 bpm No LVH, Q waves, or ST/T wave changes  Pulmonary:     Effort: Pulmonary effort is normal.     Breath sounds: No stridor. No wheezing, rhonchi or rales.  Abdominal:     General: Abdomen is flat.     Palpations: There is no mass.     Tenderness: There is no abdominal tenderness. There is no guarding.     Hernia: No hernia is present.  Musculoskeletal:     Cervical back: Neck supple.     Right lower leg: No edema.     Left lower leg: No edema.  Lymphadenopathy:     Cervical: No cervical adenopathy.  Skin:    Findings: No rash.  Neurological:     General: No focal deficit present.     Mental Status: She is alert. Mental status is at baseline.  Psychiatric:        Mood and Affect: Mood normal.        Behavior: Behavior normal.     Lab Results  Component Value Date   WBC 5.7 03/22/2024   HGB 12.0 03/22/2024   HCT 35.9 (L) 03/22/2024   PLT 200.0 03/22/2024   GLUCOSE 90 03/22/2024   CHOL 141 03/22/2024   TRIG 91.0 03/22/2024   HDL 53.40 03/22/2024   LDLCALC 69 03/22/2024   ALT 20 03/22/2024   AST 20 03/22/2024   NA 140 03/22/2024   K 3.7 03/22/2024   CL 107 03/22/2024   CREATININE 0.62 03/22/2024   BUN 7 03/22/2024   CO2 26 03/22/2024   TSH 0.88 03/22/2024   INR 1.1 (H) 01/05/2023    The 10-year ASCVD risk score (Arnett DK, et al., 2019) is: 2%   Values used to calculate the score:     Age: 25 years     Clinically relevant sex: Female     Is  Non-Hispanic African American: Yes     Diabetic: No     Tobacco smoker: No     Systolic Blood Pressure: 138 mmHg     Is BP treated: Yes     HDL Cholesterol: 53.4 mg/dL     Total Cholesterol: 141 mg/dL   Assessment & Plan:  Urticaria- Will restart meds. -     CBC with Differential/Platelet; Future -     C-reactive protein; Future -  Ambulatory referral to Allergy -     Levocetirizine Dihydrochloride ; Take 1 tablet (5 mg total) by mouth every evening.  Dispense: 90 tablet; Refill: 0 -     Montelukast  Sodium; Take 1 tablet (10 mg total) by mouth at bedtime.  Dispense: 90 tablet; Refill: 0  Primary hypertension- Will try to achieve better BP control. -     Basic metabolic panel with GFR; Future -     CBC with Differential/Platelet; Future -     TSH; Future -     Urinalysis, Routine w reflex microscopic; Future -     Hepatic function panel; Future -     EKG 12-Lead -     amLODIPine  Besylate; Take 1 tablet (5 mg total) by mouth daily.  Dispense: 90 tablet; Refill: 0 -     Spironolactone ; Take 1 tablet (25 mg total) by mouth daily.  Dispense: 90 tablet; Refill: 0 -     AMB Referral [facility]BCI Care Management  Visit for screening mammogram -     Digital Screening Mammogram, Left and Right; Future  Dyslipidemia, goal LDL below 130- Statin is not indicated. -     Lipid panel; Future -     TSH; Future -     Hepatic function panel; Future  Obesity (BMI 30.0-34.9) -     Zepbound ; Inject 2.5 mg into the skin once a week.  Dispense: 4 mL; Refill: 0 -     AMB Referral [facility]BCI Care Management     Follow-up: Return in about 3 months (around 06/20/2024).  Debby Molt, MD "

## 2024-03-23 ENCOUNTER — Telehealth: Payer: Self-pay | Admitting: *Deleted

## 2024-03-23 NOTE — Progress Notes (Signed)
 Care Guide Pharmacy Note  03/23/2024 Name: [name] MRN: [id number] DOB: May 17, 1980  Referred By: Joshua Debby CROME, MD Reason for referral: Complex Care Management (Outreach to schedule referral with pharmacist )   [name] is a 44 y.o. year old female who is a primary care patient of Joshua Debby CROME, MD.  [name] was referred to the pharmacist for assistance related to: HTN  Successful contact was made with the patient to discuss pharmacy services including being ready for the pharmacist to call at least 5 minutes before the scheduled appointment time and to have medication bottles and any blood pressure readings ready for review. The patient agreed to meet with the pharmacist via telephone visit on 03/31/2024  Thedford Franks, CMA, AAMA [place]  [facility], [facility] Guide, Lead Direct Dial: [phone]  Fax: [phone]

## 2024-03-24 ENCOUNTER — Other Ambulatory Visit: Payer: Self-pay | Admitting: Internal Medicine

## 2024-03-24 ENCOUNTER — Encounter: Payer: Self-pay | Admitting: Obstetrics and Gynecology

## 2024-03-24 ENCOUNTER — Other Ambulatory Visit (HOSPITAL_COMMUNITY)
Admission: RE | Admit: 2024-03-24 | Discharge: 2024-03-24 | Disposition: A | Source: Ambulatory Visit | Attending: Family Medicine | Admitting: Family Medicine

## 2024-03-24 ENCOUNTER — Other Ambulatory Visit: Payer: Self-pay

## 2024-03-24 ENCOUNTER — Ambulatory Visit: Admitting: Obstetrics and Gynecology

## 2024-03-24 VITALS — BP 143/100 | HR 77 | Wt 168.0 lb

## 2024-03-24 DIAGNOSIS — N939 Abnormal uterine and vaginal bleeding, unspecified: Secondary | ICD-10-CM

## 2024-03-24 DIAGNOSIS — Z0000 Encounter for general adult medical examination without abnormal findings: Secondary | ICD-10-CM | POA: Diagnosis not present

## 2024-03-24 DIAGNOSIS — Z113 Encounter for screening for infections with a predominantly sexual mode of transmission: Secondary | ICD-10-CM

## 2024-03-24 DIAGNOSIS — E66811 Obesity, class 1: Secondary | ICD-10-CM

## 2024-03-24 DIAGNOSIS — N946 Dysmenorrhea, unspecified: Secondary | ICD-10-CM

## 2024-03-24 MED ORDER — SEMAGLUTIDE-WEIGHT MANAGEMENT 0.5 MG/0.5ML ~~LOC~~ SOAJ: 0.5000 mg | SUBCUTANEOUS | 0 refills | Status: AC

## 2024-03-24 MED ORDER — SLYND 4 MG PO TABS: 1.0000 | ORAL_TABLET | Freq: Every day | ORAL | 11 refills | Status: DC

## 2024-03-24 MED ORDER — SEMAGLUTIDE-WEIGHT MANAGEMENT 0.25 MG/0.5ML ~~LOC~~ SOAJ: 0.2500 mg | SUBCUTANEOUS | 0 refills | Status: DC

## 2024-03-24 MED ORDER — SEMAGLUTIDE-WEIGHT MANAGEMENT 1.7 MG/0.75ML ~~LOC~~ SOAJ: 1.7000 mg | SUBCUTANEOUS | 0 refills | Status: AC

## 2024-03-24 MED ORDER — SEMAGLUTIDE-WEIGHT MANAGEMENT 2.4 MG/0.75ML ~~LOC~~ SOAJ: 2.4000 mg | SUBCUTANEOUS | 0 refills | Status: AC

## 2024-03-24 MED ORDER — INSULIN PEN NEEDLE 32G X 6 MM MISC: 1.0000 | 1 refills | Status: DC

## 2024-03-24 MED ORDER — SEMAGLUTIDE-WEIGHT MANAGEMENT 1 MG/0.5ML ~~LOC~~ SOAJ: 1.0000 mg | SUBCUTANEOUS | 0 refills | Status: AC

## 2024-03-24 MED ORDER — SEMAGLUTIDE-WEIGHT MANAGEMENT 0.5 MG/0.5ML ~~LOC~~ SOAJ: 0.5000 mg | SUBCUTANEOUS | 0 refills | Status: DC

## 2024-03-24 MED ORDER — SEMAGLUTIDE-WEIGHT MANAGEMENT 0.25 MG/0.5ML ~~LOC~~ SOAJ: 0.2500 mg | SUBCUTANEOUS | 0 refills | Status: AC

## 2024-03-24 MED ORDER — SEMAGLUTIDE-WEIGHT MANAGEMENT 2.4 MG/0.75ML ~~LOC~~ SOAJ: 2.4000 mg | SUBCUTANEOUS | 0 refills | Status: DC

## 2024-03-24 MED ORDER — INSULIN PEN NEEDLE 32G X 6 MM MISC: 1.0000 | 1 refills | Status: AC

## 2024-03-24 MED ORDER — SEMAGLUTIDE-WEIGHT MANAGEMENT 1.7 MG/0.75ML ~~LOC~~ SOAJ: 1.7000 mg | SUBCUTANEOUS | 0 refills | Status: DC

## 2024-03-24 MED ORDER — SEMAGLUTIDE-WEIGHT MANAGEMENT 1 MG/0.5ML ~~LOC~~ SOAJ: 1.0000 mg | SUBCUTANEOUS | 0 refills | Status: DC

## 2024-03-24 NOTE — Progress Notes (Unsigned)
 "  ANNUAL EXAM Patient name: [name] MRN [id number]  Date of birth: Aug 31, 1980 Chief Complaint:   Gynecologic Exam  History of Present Illness:   [name] is a 44 y.o. [phone] being seen today for a routine annual exam.  Current complaints: menstural changes   Clear vaginal discharge; will lift heavy boxes and notices that her panites will be wet. Having vulvar irritation, changes soap a lot and uses nair for hair removal and now trims. Has hives per another doc and started on allergy meds. No odor. Sep, oct, nov  - no period, returned 12/31 and ended 1/6. Wondering if she is entering menopause and can it be stopped. Having hot flashes and irritability. Reported to nurse that she is interested in hysterectomy because she hates her period. Sex drive is fine (not having intercourse because partner incarcerated). Has tried birth control and will forget because she's taking a lot of meds  Menstrual concerns? Yes   Breast or nipple changes? Yes nipple sorness, bilaterally without any discharge or lumps Contraception use? Yes s/p TL Sexually active? Yes not more recently   Discussed the use of AI scribe software for clinical note transcription with the patient, who gave verbal consent to proceed.  History of Present Illness [name] is a 44 year old female who presents with concerns about heavy menstrual bleeding and potential treatment options.  She reports heavier than usual menstrual periods and wants treatment options. She has not used Megace  due to difficulty swallowing larger pills and has been inconsistent with TXA. She prefers smaller pills or non-pill options such as patch or ring and wishes to avoid hysterectomy because of concern about risks and recovery.  She recently started blood pressure medication and has been taking it consistently since starting it.  She is interested in birth control methods that can reduce or stop bleeding and do not require  daily dosing due to poor pill adherence.  She notes bilateral nipple soreness without discharge or palpable lumps. She has a mammogram scheduled for next week. She is sexually active.    Patient's last menstrual period was 03/16/2024 (exact date).   The pregnancy intention screening data noted above was reviewed. Potential methods of contraception were discussed. The patient elected to proceed with No data recorded.   Last pap     Component Value Date/Time   DIAGPAP (A) 10/06/2022 1409    - Atypical squamous cells of undetermined significance (ASC-US )   DIAGPAP - Low grade squamous intraepithelial lesion (LSIL) (A) 07/13/2020 0959   HPVHIGH Negative 10/06/2022 1409   HPVHIGH Positive (A) 07/13/2020 0959   ADEQPAP  10/06/2022 1409    Satisfactory for evaluation; transformation zone component PRESENT.   ADEQPAP  07/13/2020 0959    Satisfactory for evaluation; transformation zone component PRESENT.    Last mammogram: 10/2022 BIRADS 1.  Last colonoscopy: n/a.      03/22/2024    8:51 AM 05/13/2023    8:30 AM 01/15/2023    5:25 PM 01/15/2023    4:00 PM 10/29/2022    3:14 PM  Depression screen PHQ 2/9  Decreased Interest 0 0 0 0 0  Down, Depressed, Hopeless 0 0 0 0 0  PHQ - 2 Score 0 0 0 0 0  Altered sleeping 0  0 0 0  Tired, decreased energy 0  0 0 0  Change in appetite 0  0 0 0  Feeling bad or failure about yourself  0  0 0 0  Trouble  concentrating 0  0 0 0  Moving slowly or fidgety/restless 0  0 0 0  Suicidal thoughts 0  0 0 0  PHQ-9 Score 0  0  0  0   Difficult doing work/chores Not difficult at all         Data saved with a previous flowsheet row definition        03/22/2024    8:51 AM 01/15/2023    5:25 PM 01/15/2023    4:01 PM 10/29/2022    3:14 PM  GAD 7 : Generalized Anxiety Score  Nervous, Anxious, on Edge 0 0 0 0  Control/stop worrying 0 0 0 0  Worry too much - different things 0 0 0 0  Trouble relaxing 0 0 0 0  Restless 0 0 0 0  Easily annoyed or irritable  0 0 0 0  Afraid - awful might happen 0 0 0 0  Total GAD 7 Score 0 0 0 0  Anxiety Difficulty Not difficult at all        Review of Systems:   Pertinent items are noted in HPI Denies any headaches, blurred vision, fatigue, shortness of breath, chest pain, abdominal pain, abnormal vaginal discharge/itching/odor/irritation, problems with periods, bowel movements, urination, or intercourse unless otherwise stated above. Pertinent History Reviewed:  Reviewed past medical,surgical, social and family history.  Reviewed problem list, medications and allergies. Physical Assessment:   Vitals:   03/24/24 1553  BP: (!) 143/100  Pulse: 77  Weight: 168 lb (76.2 kg)  Body mass index is 33.93 kg/m.        Physical Examination:   General appearance - well appearing, and in no distress  Mental status - alert, oriented to person, place, and time  Psych:  She has a normal mood and affect  Skin - warm and dry, normal color, no suspicious lesions noted  Chest - effort normal, all lung fields clear to auscultation bilaterally  Heart - normal rate and regular rhythm  Abdomen - soft, nontender, nondistended, no masses or organomegaly  Pelvic - deferred  Extremities:  No swelling or varicosities noted  Chaperone present for exam  No results found for this or any previous visit (from the past 24 hours).    Assessment & Plan:   Assessment & Plan Woman's Wellness Visit Routine wellness visit with discussion on menstrual health and management options for abnormal uterine bleeding.  - She prefers non-surgical options due to concerns about recovery and menopause. - Prescribed Slynd  for menstrual regulation and bleeding control. - Scheduled follow-up in a couple of months to assess response to Slynd .  Abnormal Uterine Bleeding with Dysmenorrhea Chronic abnormal uterine bleeding with dysmenorrhea. Previous treatments with TXA and Megace  were not initiated due to pill size and adherence issues.  Discussed non-surgical and surgical options including endometrial ablation, uterine artery embolization, and hormonal IUDs. She prefers to wait until menopause instead of surgical intervention due to recovery concerns and preference to avoid menopause symptoms. - Prescribed Slynd  for menstrual regulation and bleeding control.  Screening for Sexually Transmitted Infections Screening for sexually transmitted infections discussed. She is sexually active and agreed to undergo screening. - Performed vaginal swab for gonorrhea, chlamydia, and trichomonas. - Ordered blood tests for HIV, syphilis, and hepatitis.  Hypertension Management per PCP. She has recently started a new blood pressure medication regimen with smaller pills for better adherence. Blood pressure was high at the last visit, but she has been taking the medication consistently. - Continue current blood pressure medication regimen. -  Monitor blood pressure regularly.  Breast Pain Bilateral nipple soreness without discharge or lumps. Scheduled for a mammogram next week. Bilateral symptoms are less concerning for malignancy. - Proceed with scheduled mammogram.    Orders Placed This Encounter  Procedures   RPR+HBsAg+HCVAb+...    Meds:  Meds ordered this encounter  Medications   Drospirenone  (SLYND ) 4 MG TABS    Sig: Take 1 tablet (4 mg total) by mouth daily.    Dispense:  28 tablet    Refill:  11    Follow-up: No follow-ups on file.  Carter Quarry, MD 03/24/2024 4:04 PM  "

## 2024-03-25 ENCOUNTER — Ambulatory Visit: Payer: Self-pay | Admitting: Obstetrics and Gynecology

## 2024-03-25 LAB — RPR+HBSAG+HCVAB+...
HIV Screen 4th Generation wRfx: NONREACTIVE
Hep C Virus Ab: NONREACTIVE
Hepatitis B Surface Ag: NEGATIVE
RPR Ser Ql: NONREACTIVE

## 2024-03-28 ENCOUNTER — Ambulatory Visit

## 2024-03-28 LAB — CERVICOVAGINAL ANCILLARY ONLY
Chlamydia: NEGATIVE
Comment: NEGATIVE
Comment: NEGATIVE
Comment: NORMAL
Neisseria Gonorrhea: NEGATIVE
Trichomonas: NEGATIVE

## 2024-03-31 ENCOUNTER — Other Ambulatory Visit

## 2024-03-31 VITALS — BP 132/86

## 2024-03-31 DIAGNOSIS — E66811 Obesity, class 1: Secondary | ICD-10-CM

## 2024-03-31 DIAGNOSIS — I10 Essential (primary) hypertension: Secondary | ICD-10-CM

## 2024-03-31 NOTE — Progress Notes (Signed)
 "  03/31/2024 Name: [name] MRN: [id number] DOB: [birth year]  Chief Complaint  Patient presents with   Hypertension   Obesity   Medication Management    [name] is a 44 y.o. year old female who presented for a telephone visit.   They were referred to the pharmacist by their PCP for assistance in managing hypertension and weight management.    Subjective:  Care Team: Primary Care Provider: Joshua Debby CROME, MD ; Next Scheduled Visit: none scheduled   Medication Access/Adherence  Current Pharmacy:  [facility] [place] [address] [place] - [address] [place] [address] Phone: [phone] Fax: [phone]  [id number] - [place], [place] - [address] AT [facility] [address] [place] [address] Phone: [phone] Fax: [phone]   Patient reports affordability concerns with their medications: No  Patient reports access/transportation concerns to their pharmacy: No  Patient reports adherence concerns with their medications:  No     Hypertension:  Current medications: Amlodipine  5 mg daily, spironolactone  25 mg daily Medications previously tried: triamterene /HCTZ  Patient has a validated, automated, upper arm home BP cuff Current blood pressure readings: has not checked since new medication added. Checked while on the phone call and it was 132/86  Patient denies hypotensive s/sx including dizziness, lightheadedness.    Obesity/Overweight, Complicated by hypertension:  Current medications: prescribed Wegovy , but has not received yet. Pharmacy is waiting for PA.  Current physical activity: endorses current exercise program  Current medication access support: Medicaid   Objective:  No results found for: HGBA1C  Lab Results  Component Value Date   CREATININE 0.62 03/22/2024   BUN 7 03/22/2024   NA 140 03/22/2024   K 3.7 03/22/2024   CL 107  03/22/2024   CO2 26 03/22/2024    Lab Results  Component Value Date   CHOL 141 03/22/2024   HDL 53.40 03/22/2024   LDLCALC 69 03/22/2024   TRIG 91.0 03/22/2024   CHOLHDL 3 03/22/2024    Medications Reviewed Today     Reviewed by Merceda Lela SAUNDERS, RPH-CPP (Pharmacist) on 03/31/24 at 1557  Med List Status: <None>   Medication Order Taking? Sig Documenting Provider Last Dose Status Informant  amLODipine  (NORVASC ) 5 MG tablet [id number] Yes Take 1 tablet (5 mg total) by mouth daily. Joshua Debby CROME, MD  Active   Drospirenone  (SLYND ) 4 MG TABS [id number]  Take 1 tablet (4 mg total) by mouth daily. Ajewole, Christana, MD  Active   Insulin  Pen Needle 32G X 6 MM MISC [id number]  1 Act by Does not apply route once a week.  Patient not taking: Reported on 03/24/2024   Joshua Debby CROME, MD  Active   levocetirizine (XYZAL ) 5 MG tablet [id number]  Take 1 tablet (5 mg total) by mouth every evening. Joshua Debby CROME, MD  Active   montelukast  (SINGULAIR ) 10 MG tablet [id number]  Take 1 tablet (10 mg total) by mouth at bedtime. Joshua Debby CROME, MD  Active   semaglutide -weight management (WEGOVY ) 0.25 MG/0.5ML SOAJ SQ injection [id number]  Inject 0.25 mg into the skin once a week for 28 days.  Patient not taking: Reported on 03/31/2024   Joshua Debby CROME, MD  Active   semaglutide -weight management (WEGOVY ) 0.5 MG/0.5ML SOAJ SQ injection [id number]  Inject 0.5 mg into the skin once a week for 28 days.  Patient not taking: Reported on 03/31/2024   Joshua Debby CROME, MD  Active  semaglutide -weight management (WEGOVY ) 1 MG/0.5ML SOAJ SQ injection [id number]  Inject 1 mg into the skin once a week for 28 days.  Patient not taking: Reported on 03/31/2024   Joshua Debby CROME, MD  Active   semaglutide -weight management (WEGOVY ) 1.7 MG/0.75ML SOAJ SQ injection [id number]  Inject 1.7 mg into the skin once a week for 28 days.  Patient not taking: Reported on 03/31/2024   Joshua Debby CROME, MD  Active   semaglutide -weight  management (WEGOVY ) 2.4 MG/0.75ML SOAJ SQ injection [id number]  Inject 2.4 mg into the skin once a week for 28 days.  Patient not taking: Reported on 03/31/2024   Joshua Debby CROME, MD  Active   spironolactone  (ALDACTONE ) 25 MG tablet [id number] Yes Take 1 tablet (25 mg total) by mouth daily. Joshua Debby CROME, MD  Active               Assessment/Plan:   Hypertension: - Currently uncontrolled. BP goal <130/80. BP improved but still above goal - Reviewed long term cardiovascular and renal outcomes of uncontrolled blood pressure - Reviewed appropriate blood pressure monitoring technique and reviewed goal blood pressure. Recommended to check home blood pressure and heart rate 3x per week and keep a log - Recommend to continue current regimen and f/u home BP readings    Obesity/Overweight: - Currently unable to achieve goal weight loss of 5-10% through diet and lifestyle modifications alone - Dietitian referral - Extensive exercise counseling including eventual goal of 150 minutes of moderate intensity exercise weekly - Submitted PA for Wegovy   Follow Up Plan: 3 weeks  Darrelyn Drum, PharmD, BCPS, CPP Clinical Pharmacist Practitioner [place] [facility] [phone]    "

## 2024-04-01 ENCOUNTER — Telehealth: Payer: Self-pay | Admitting: Pharmacist

## 2024-04-01 NOTE — Progress Notes (Signed)
 PA for Wegovy  was submitted yesterday and was denied due to missing documentation on lifestyle modifications. Contacted pt's insurance to submit for redetermination. Spoke to representative who initiated redetermination case and faxed updated documentation for the Wegovy  PA. Per the rep, we should receive a fax within 24 hours on the decision for this PA.  Darrelyn Drum, PharmD, BCPS, CPP Clinical Pharmacist Practitioner [place] [facility] [phone]

## 2024-04-07 ENCOUNTER — Telehealth: Payer: Self-pay

## 2024-04-07 NOTE — Progress Notes (Signed)
 Unsuccessful attempt to reach patient to discuss PA for Wegovy .

## 2024-04-08 NOTE — Telephone Encounter (Signed)
 Can somebody please assist me with this PA that's needed ?

## 2024-04-13 ENCOUNTER — Other Ambulatory Visit (HOSPITAL_COMMUNITY): Payer: Self-pay

## 2024-04-21 ENCOUNTER — Other Ambulatory Visit: Payer: Self-pay

## 2024-04-21 DIAGNOSIS — N939 Abnormal uterine and vaginal bleeding, unspecified: Secondary | ICD-10-CM

## 2024-04-21 DIAGNOSIS — N946 Dysmenorrhea, unspecified: Secondary | ICD-10-CM

## 2024-04-21 MED ORDER — SLYND 4 MG PO TABS: 1.0000 | ORAL_TABLET | Freq: Every day | ORAL | 11 refills | Status: AC

## 2024-04-21 NOTE — Telephone Encounter (Signed)
 Patient presents for evaluation and management of obesity. Current BMI: 33.93 kg/m. Current Weight is 168 lbs.  Patient has struggled with excess weight for several years and reports difficulty achieving and sustaining clinically meaningful weight loss with lifestyle modification alone. Patient has participated in dietary and physical activity interventions for at least 2 months, including: Reduced-calorie diet: wakes up hungry at night and snacks - has been trying to be better about this, when she does snack, she has been trying to eat Greek yogurt, fruit. tries to avoid fried foods; tries to focus on fish/salmon, chicken, cuts back on frying chicken, cooks her food separately from what she feeds her kids. Has tried to reduce portions. Has tried to cut down on rice. Has been trying these for about 2 months - no weight changes.  Increased physical activity: Walks on treadmill with goal to do 3-4x times weekly for 45 minutes. Has been attempting this for 1.5 months and has not noticed changes in weight Despite adherence to these interventions, patient has not achieved adequate or sustained weight loss.   Patient was counseled on the importance of ongoing lifestyle modification as the foundation of weight management and as a required adjunct to pharmacologic therapy.  Dietary Counseling: Reviewed principles of a reduced-calorie diet with emphasis on portion control Encouraged increased intake of lean protein, vegetables, fruits, and whole grains Advised limiting processed foods, added sugars, and sugar-sweetened beverages Discussed mindful eating strategies and regular meal patterns Patient encouraged to track dietary intake and maintain consistency  Physical Activity Counseling: Reviewed goal of >=150 minutes per week of moderate-intensity aerobic activity (e.g., brisk walking, cycling), as tolerated Encouraged strength/resistance training at least 2 days per week Discussed gradual progression,  injury prevention, and incorporating activity into daily routine  Darrelyn Drum, PharmD, BCPS, CPP Clinical Pharmacist Practitioner [facility] [phone]

## 2024-05-03 ENCOUNTER — Ambulatory Visit: Payer: Self-pay | Admitting: Allergy & Immunology
